# Patient Record
Sex: Male | Born: 1975
Health system: Southern US, Community
[De-identification: ages and names within clinical notes are randomized; demographics above are authoritative.]

## PROBLEM LIST (undated history)

## (undated) DIAGNOSIS — L0390 Cellulitis, unspecified: Secondary | ICD-10-CM

## (undated) DIAGNOSIS — E669 Obesity, unspecified: Secondary | ICD-10-CM

## (undated) DIAGNOSIS — I10 Essential (primary) hypertension: Secondary | ICD-10-CM

## (undated) DIAGNOSIS — E7800 Pure hypercholesterolemia, unspecified: Secondary | ICD-10-CM

## (undated) DIAGNOSIS — R600 Localized edema: Secondary | ICD-10-CM

## (undated) HISTORY — PX: OTHER SURGICAL HISTORY: SHX169

---

## 2005-11-05 ENCOUNTER — Emergency Department (HOSPITAL_COMMUNITY): Admission: EM | Admit: 2005-11-05 | Discharge: 2005-11-05 | Payer: Self-pay | Admitting: Emergency Medicine

## 2007-11-05 ENCOUNTER — Emergency Department (HOSPITAL_COMMUNITY): Admission: EM | Admit: 2007-11-05 | Discharge: 2007-11-06 | Payer: Self-pay | Admitting: Family Medicine

## 2008-02-06 ENCOUNTER — Emergency Department (HOSPITAL_COMMUNITY): Admission: EM | Admit: 2008-02-06 | Discharge: 2008-02-07 | Payer: Self-pay | Admitting: Emergency Medicine

## 2008-02-09 ENCOUNTER — Emergency Department (HOSPITAL_COMMUNITY): Admission: EM | Admit: 2008-02-09 | Discharge: 2008-02-09 | Payer: Self-pay | Admitting: Emergency Medicine

## 2009-11-29 ENCOUNTER — Emergency Department (HOSPITAL_COMMUNITY): Admission: EM | Admit: 2009-11-29 | Discharge: 2009-11-29 | Payer: Self-pay | Admitting: Family Medicine

## 2009-12-02 ENCOUNTER — Emergency Department (HOSPITAL_COMMUNITY): Admission: EM | Admit: 2009-12-02 | Discharge: 2009-12-02 | Payer: Self-pay | Admitting: Family Medicine

## 2010-04-28 ENCOUNTER — Emergency Department (HOSPITAL_COMMUNITY): Admission: EM | Admit: 2010-04-28 | Discharge: 2010-04-29 | Payer: Self-pay | Admitting: Emergency Medicine

## 2010-09-29 LAB — CBC
HCT: 44.2 % (ref 39.0–52.0)
Hemoglobin: 14.9 g/dL (ref 13.0–17.0)
MCHC: 33.7 g/dL (ref 30.0–36.0)
MCV: 87.7 fL (ref 78.0–100.0)
Platelets: 299 10*3/uL (ref 150–400)
RBC: 5.04 MIL/uL (ref 4.22–5.81)
RDW: 13.1 % (ref 11.5–15.5)
WBC: 6.9 10*3/uL (ref 4.0–10.5)

## 2010-09-29 LAB — DIFFERENTIAL
Basophils Absolute: 0 10*3/uL (ref 0.0–0.1)
Monocytes Relative: 11 % (ref 3–12)
Neutro Abs: 3.5 10*3/uL (ref 1.7–7.7)

## 2010-09-29 LAB — BASIC METABOLIC PANEL
BUN: 9 mg/dL (ref 6–23)
CO2: 32 mEq/L (ref 19–32)
Glucose, Bld: 98 mg/dL (ref 70–99)

## 2011-04-11 LAB — POCT I-STAT, CHEM 8
Chloride: 105
Glucose, Bld: 79
HCT: 44
Hemoglobin: 15
Sodium: 140

## 2011-04-11 LAB — POCT CARDIAC MARKERS: Troponin i, poc: <0.05

## 2012-04-18 ENCOUNTER — Observation Stay (HOSPITAL_COMMUNITY)
Admission: EM | Admit: 2012-04-18 | Discharge: 2012-04-19 | Disposition: A | Discharge status: Home / Self Care | Payer: Self-pay | Attending: Emergency Medicine | Admitting: Emergency Medicine

## 2012-04-18 ENCOUNTER — Encounter (HOSPITAL_COMMUNITY): Payer: Self-pay | Admitting: Emergency Medicine

## 2012-04-18 DIAGNOSIS — F17200 Nicotine dependence, unspecified, uncomplicated: Secondary | ICD-10-CM | POA: Insufficient documentation

## 2012-04-18 DIAGNOSIS — L0390 Cellulitis, unspecified: Secondary | ICD-10-CM | POA: Insufficient documentation

## 2012-04-18 DIAGNOSIS — L02419 Cutaneous abscess of limb, unspecified: Principal | ICD-10-CM | POA: Insufficient documentation

## 2012-04-18 DIAGNOSIS — I10 Essential (primary) hypertension: Secondary | ICD-10-CM | POA: Insufficient documentation

## 2012-04-18 HISTORY — DX: Essential (primary) hypertension: I10

## 2012-04-18 HISTORY — DX: Cellulitis, unspecified: L03.90

## 2012-04-18 LAB — COMPREHENSIVE METABOLIC PANEL
ALT: 11 U/L (ref 0–53)
Albumin: 3.9 g/dL (ref 3.5–5.2)
CO2: 30 mEq/L (ref 19–32)
Chloride: 102 mEq/L (ref 96–112)
Creatinine, Ser: 0.92 mg/dL (ref 0.50–1.35)
GFR calc Af Amer: >90 mL/min (ref >90)
Glucose, Bld: 125 mg/dL — ABNORMAL HIGH (ref 70–99)
Potassium: 3.9 mEq/L (ref 3.5–5.1)

## 2012-04-18 LAB — CBC WITH DIFFERENTIAL/PLATELET
Basophils Absolute: 0 10*3/uL (ref 0.0–0.1)
Basophils Relative: 0 % (ref 0–1)
Eosinophils Absolute: 0.2 10*3/uL (ref 0.0–0.7)
HCT: 43 % (ref 39.0–52.0)
Hemoglobin: 14.7 g/dL (ref 13.0–17.0)
Lymphocytes Relative: 37 % (ref 12–46)
Monocytes Absolute: 0.4 10*3/uL (ref 0.1–1.0)
Monocytes Relative: 6 % (ref 3–12)

## 2012-04-18 MED ORDER — SODIUM CHLORIDE 0.9 % IV SOLN
INTRAVENOUS | Status: DC
  Administered 2012-04-18: 23:00:00 via INTRAVENOUS

## 2012-04-18 MED ORDER — VANCOMYCIN HCL IN DEXTROSE 1-5 GM/200ML-% IV SOLN
1000.0000 mg | Freq: Once | INTRAVENOUS | Status: AC
  Administered 2012-04-18: 1000 mg via INTRAVENOUS
  Filled 2012-04-18: qty 200

## 2012-04-18 MED ORDER — VANCOMYCIN HCL IN DEXTROSE 1-5 GM/200ML-% IV SOLN
1000.0000 mg | Freq: Two times a day (BID) | INTRAVENOUS | Status: DC
  Administered 2012-04-19: 1000 mg via INTRAVENOUS
  Filled 2012-04-18: qty 200

## 2012-04-18 NOTE — ED Notes (Signed)
Patient complaining of left leg swelling since Tuesday; patient has used elevation and ice pack techniques to decrease the swelling.  While patient was working today, swelling increased.  Patient has been to Urgent Care in the past for similar symptoms and diagnosed with cellulitis.  Reports pain in left leg.

## 2012-04-18 NOTE — ED Provider Notes (Signed)
Medical screening examination/treatment/procedure(s) were conducted as a shared visit with non-physician practitioner(s) and myself.  I personally evaluated the patient during the encounter  Shelda Jakes, MD 04/18/12 2356

## 2012-04-18 NOTE — ED Provider Notes (Signed)
History     CSN: [id number]  Arrival date & time 04/18/12  2032   First MD Initiated Contact with Patient 04/18/12 2150      Chief Complaint  Patient presents with  . Leg Swelling    (Consider location/radiation/quality/duration/timing/severity/associated sxs/prior treatment) The history is provided by the patient.   patient is a 36 year old male presents with left lower trimming the swelling anterior part of the shin that started on Sunday that would be 5 days ago. It got worse on Tuesday patient's had similar problem in the past several months ago to even a year ago. No real pain in the left lower extremity patient reports maybe one out of 10. No chest pain no shortness of breath no fever no bowel pain no nausea no vomiting.  Past Medical History  Diagnosis Date  . Hypertension   . Cellulitis     Past Surgical History  Procedure Date  . Knee surgery     History reviewed. No pertinent family history.  History  Substance Use Topics  . Smoking status: Current Every Day Smoker -- 1.0 packs/day  . Smokeless tobacco: Not on file  . Alcohol Use: Yes      Review of Systems  Constitutional: Negative for fever.  HENT: Negative for congestion and neck pain.   Eyes: Negative for redness.  Respiratory: Negative for shortness of breath.   Cardiovascular: Positive for leg swelling. Negative for chest pain.  Gastrointestinal: Negative for nausea, vomiting and abdominal pain.  Genitourinary: Negative for dysuria.  Musculoskeletal: Negative for back pain.  Skin: Positive for color change. Negative for rash.  Neurological: Negative for headaches.  Hematological: Does not bruise/bleed easily.    Allergies  Review of patient's allergies indicates no known allergies.  Home Medications   Current Outpatient Rx  Name Route Sig Dispense Refill  . VALACYCLOVIR HCL 500 MG PO TABS Oral Take 500 mg by mouth daily as needed. Herpes      BP 158/93  Pulse 105  Temp 98.7 F (37.1  C) (Oral)  Resp 18  SpO2 94%  Physical Exam  Nursing note and vitals reviewed. Constitutional: He is oriented to person, place, and time. He appears well-developed and well-nourished. No distress.  HENT:  Head: Normocephalic and atraumatic.  Mouth/Throat: Oropharynx is clear and moist.  Eyes: Conjunctivae normal and EOM are normal. Pupils are equal, round, and reactive to light.  Neck: Normal range of motion. Neck supple.  Cardiovascular: Normal rate, regular rhythm and normal heart sounds.   No murmur heard. Pulmonary/Chest: Effort normal and breath sounds normal.  Abdominal: Soft. Bowel sounds are normal. There is no tenderness.  Musculoskeletal: He exhibits edema.       Swelling of the left lower extremity not above the knee. Mid part of extremity anteriorly he has erythema measuring about 10-12 cm in size. Increased warmth. No significant tenderness. Lower part of the extremity has some dark skin discoloration some chronic edema. There is edema of the leg and ankle.  Neurological: He is alert and oriented to person, place, and time. No cranial nerve deficit. He exhibits normal muscle tone. Coordination normal.  Skin: Skin is warm. There is erythema.    ED Course  Procedures (including critical care time)  Labs Reviewed  COMPREHENSIVE METABOLIC PANEL - Abnormal; Notable for the following:    Glucose, Bld 125 (*)     Calcium 11.3 (*)     Total Protein 8.6 (*)     All other components within normal limits  CBC WITH DIFFERENTIAL   No results found. Results for orders placed during the hospital encounter of 04/18/12  CBC WITH DIFFERENTIAL      Component Value Range   WBC 7.4  4.0 - 10.5 K/uL   RBC 5.01  4.22 - 5.81 MIL/uL   Hemoglobin 14.7  13.0 - 17.0 g/dL   HCT 09.8  11.9 - 14.7 %   MCV 85.8  78.0 - 100.0 fL   MCH 29.3  26.0 - 34.0 pg   MCHC 34.2  30.0 - 36.0 g/dL   RDW 82.9  56.2 - 13.0 %   Platelets 267  150 - 400 K/uL   Neutrophils Relative 55  43 - 77 %   Neutro  Abs 4.0  1.7 - 7.7 K/uL   Lymphocytes Relative 37  12 - 46 %   Lymphs Abs 2.7  0.7 - 4.0 K/uL   Monocytes Relative 6  3 - 12 %   Monocytes Absolute 0.4  0.1 - 1.0 K/uL   Eosinophils Relative 3  0 - 5 %   Eosinophils Absolute 0.2  0.0 - 0.7 K/uL   Basophils Relative 0  0 - 1 %   Basophils Absolute 0.0  0.0 - 0.1 K/uL  COMPREHENSIVE METABOLIC PANEL      Component Value Range   Sodium 140  135 - 145 mEq/L   Potassium 3.9  3.5 - 5.1 mEq/L   Chloride 102  96 - 112 mEq/L   CO2 30  19 - 32 mEq/L   Glucose, Bld 125 (*) 70 - 99 mg/dL   BUN 11  6 - 23 mg/dL   Creatinine, Ser 8.65  0.50 - 1.35 mg/dL   Calcium 78.4 (*) 8.4 - 10.5 mg/dL   Total Protein 8.6 (*) 6.0 - 8.3 g/dL   Albumin 3.9  3.5 - 5.2 g/dL   AST 15  0 - 37 U/L   ALT 11  0 - 53 U/L   Alkaline Phosphatase 73  39 - 117 U/L   Total Bilirubin 0.6  0.3 - 1.2 mg/dL   GFR calc non Af Amer >90  >90 mL/min   GFR calc Af Amer >90  >90 mL/min     1. Cellulitis       MDM    Patient clinically with a left lower extremity anterior cellulitis. Patient's had a history of that in the past not recently though. Patient will be given now 1 g of vancomycin IV piggyback we moved to CDU under the cellulitis protocol. Anticipate the patient will be a to be discharged home on doxycycline. Patient without any other significant symptoms.   Medical screening examination/treatment/procedure(s) were conducted as a shared visit with non-physician practitioner(s) and myself.  I personally evaluated the patient during the encounter         Shelda Jakes, MD 04/18/12 2242

## 2012-04-18 NOTE — ED Provider Notes (Signed)
10:53 PM Patient to move to CDU under observation, cellulitis protocol.  This is a shared visit with Dr Deretha Emory.  Sign out received from Dr Deretha Emory.  Pt with LLE cellulitis.  Plan is for cellulitis protocol overnight.  Pt receiving vancomycin.  To be reassessed in the morning.  May need to stay for second dose.  If appropriate, pt may be d/c home with doxycycline.    11:52 PM Patient has not yet been moved to CDU and has not been seen by me.  Patient signed out to Dr Bernette Mayers who assumes care of patient overnight.    [place], [place] 04/18/12 2352

## 2012-04-19 MED ORDER — CEPHALEXIN 500 MG PO CAPS: 500.0000 mg | ORAL_CAPSULE | Freq: Four times a day (QID) | ORAL | Status: DC

## 2012-04-19 MED ORDER — HYDROCODONE-ACETAMINOPHEN 5-325 MG PO TABS: ORAL_TABLET | ORAL | Status: DC

## 2012-04-19 MED ORDER — SULFAMETHOXAZOLE-TRIMETHOPRIM 800-160 MG PO TABS: 1.0000 | ORAL_TABLET | Freq: Two times a day (BID) | ORAL | Status: DC

## 2012-04-19 NOTE — ED Provider Notes (Signed)
Medical screening examination/treatment/procedure(s) were performed by non-physician practitioner and as supervising physician I was immediately available for consultation/collaboration.   Lyanne Co, MD 04/19/12 [phone]

## 2012-04-19 NOTE — ED Notes (Signed)
Patient ambulated to restroom and tolerated well.  

## 2012-04-19 NOTE — ED Notes (Signed)
Patient received meal tray.   Patient eating lunch.

## 2012-04-19 NOTE — ED Provider Notes (Signed)
7:45 AM Patient seen and examined. Previous notes reviewed.   Patient notes improvement in swelling and pain overnight. He has not yet been up walking.   Exam:  Gen NAD; Heart RRR, nml S1,S2, no m/r/g; Lungs CTAB; Abd soft, NT, no rebound or guarding; Ext 2+ pedal pulses bilaterally, bilateral lower extremity edema, light erythema anterior left lower leg, very mild warmth to touch.   Plan: Next dose of IV Vanc at 1045. Patient seems hesitant to stay at this point. Given improvement overnight, will likely d/c home on doxycycline this AM.    Patient completed second dose Vanc shortly after 1200. Continued to be stable/improved on re-exam.   Discharged patient on Bactrim and Keflex given patient paying for meds out-of-pocket.   Patient counseled on use of narcotic pain medications. Counseled not to combine these medications with others containing tylenol. Urged not to drink alcohol, drive, or perform any other activities that requires focus while taking these medications. The patient verbalizes understanding and agrees with the plan.  Pt urged to return with worsening pain, worsening swelling, expanding area of redness or streaking up extremity, fever, or any other concerns. Urged to take complete course of antibiotics as prescribed. Counseled to take pain medications as prescribed. Pt verbalizes understanding and agrees with plan.     Renne Crigler, [place] 04/19/12 1606

## 2012-04-19 NOTE — Progress Notes (Signed)
Utilization review completed.  

## 2012-04-22 ENCOUNTER — Emergency Department (HOSPITAL_COMMUNITY)
Admission: EM | Admit: 2012-04-22 | Discharge: 2012-04-22 | Disposition: A | Discharge status: Home / Self Care | Payer: Self-pay | Attending: Emergency Medicine | Admitting: Emergency Medicine

## 2012-04-22 ENCOUNTER — Encounter (HOSPITAL_COMMUNITY): Payer: Self-pay | Admitting: Emergency Medicine

## 2012-04-22 DIAGNOSIS — I10 Essential (primary) hypertension: Secondary | ICD-10-CM | POA: Insufficient documentation

## 2012-04-22 DIAGNOSIS — L02419 Cutaneous abscess of limb, unspecified: Secondary | ICD-10-CM | POA: Insufficient documentation

## 2012-04-22 DIAGNOSIS — F17200 Nicotine dependence, unspecified, uncomplicated: Secondary | ICD-10-CM | POA: Insufficient documentation

## 2012-04-22 DIAGNOSIS — L0390 Cellulitis, unspecified: Secondary | ICD-10-CM

## 2012-04-22 NOTE — ED Provider Notes (Signed)
History    This chart was scribed for [employer]. Rubin Payor, MD, MD by Smitty Pluck. The patient was seen in room TR09C and the patient's care was started at 11:44AM.   CSN: [id number]  Arrival date & time 04/22/12  1120   None     No chief complaint on file.   (Consider location/radiation/quality/duration/timing/severity/associated sxs/prior treatment) The history is provided by the patient. No language interpreter was used.   Timothy Roy is a 36 y.o. male who presents to the Emergency Department due to wound recheck on lower left leg. Pt reports that he has been taking abx for 2 days. He states that he has decreased pain and swelling. He denies any new symptoms and fever.   Past Medical History  Diagnosis Date  . Hypertension   . Cellulitis     Past Surgical History  Procedure Date  . Knee surgery     No family history on file.  History  Substance Use Topics  . Smoking status: Current Every Day Smoker -- 1.0 packs/day  . Smokeless tobacco: Not on file  . Alcohol Use: Yes      Review of Systems  Constitutional: Negative for fever and chills.  Respiratory: Negative for shortness of breath.   Gastrointestinal: Negative for nausea and vomiting.  Neurological: Negative for weakness.    Allergies  Review of patient's allergies indicates no known allergies.  Home Medications   Current Outpatient Rx  Name Route Sig Dispense Refill  . CEPHALEXIN 500 MG PO CAPS Oral Take 1 capsule (500 mg total) by mouth 4 (four) times daily. 28 capsule 0  . HYDROCODONE-ACETAMINOPHEN 5-325 MG PO TABS  Take 1-2 tablets every 6 hours as needed for severe pain 12 tablet 0  . IBUPROFEN 200 MG PO TABS Oral Take 400 mg by mouth every 6 (six) hours as needed. For headache    . SULFAMETHOXAZOLE-TRIMETHOPRIM 800-160 MG PO TABS Oral Take 1 tablet by mouth every 12 (twelve) hours. 14 tablet 0  . VALACYCLOVIR HCL 500 MG PO TABS Oral Take 500 mg by mouth daily as needed. Herpes      BP  140/84  Pulse 75  Temp 98.4 F (36.9 C)  Resp 16  SpO2 97%  Physical Exam  Nursing note and vitals reviewed. Constitutional: He is oriented to person, place, and time. He appears well-developed and well-nourished. No distress.  HENT:  Head: Normocephalic and atraumatic.  Eyes: EOM are normal.  Neck: Neck supple. No tracheal deviation present.  Cardiovascular: Normal rate.   Pulmonary/Chest: Effort normal. No respiratory distress.  Musculoskeletal: Normal range of motion.  Neurological: He is alert and oriented to person, place, and time.  Skin: Skin is warm and dry.       Chronic changes of wound on lower anterior tibia Induration present Minimal erythema and swelling present compared to previous note  Psychiatric: He has a normal mood and affect. His behavior is normal.    ED Course  Procedures (including critical care time)  COORDINATION OF CARE: 11:46 AM Discussed ED treatment with pt     Labs Reviewed - No data to display No results found.   1. Cellulitis       MDM  Patient with wound recheck for lower leg. He appears to be healing well.will continue antibiotics      I personally performed the services described in this documentation, which was scribed in my presence. The recorded information has been reviewed and considered.     Juliet Rude. Rubin Payor,  MD 04/22/12 1617

## 2012-04-22 NOTE — ED Notes (Signed)
Here for wound recheck for left lower leg area looks well healed

## 2013-03-16 ENCOUNTER — Emergency Department (HOSPITAL_COMMUNITY)
Admission: EM | Admit: 2013-03-16 | Discharge: 2013-03-16 | Disposition: A | Discharge status: Home / Self Care | Payer: Self-pay | Attending: Emergency Medicine | Admitting: Emergency Medicine

## 2013-03-16 ENCOUNTER — Encounter (HOSPITAL_COMMUNITY): Payer: Self-pay | Admitting: Emergency Medicine

## 2013-03-16 DIAGNOSIS — I10 Essential (primary) hypertension: Secondary | ICD-10-CM | POA: Insufficient documentation

## 2013-03-16 DIAGNOSIS — Z872 Personal history of diseases of the skin and subcutaneous tissue: Secondary | ICD-10-CM | POA: Insufficient documentation

## 2013-03-16 DIAGNOSIS — Z79899 Other long term (current) drug therapy: Secondary | ICD-10-CM | POA: Insufficient documentation

## 2013-03-16 DIAGNOSIS — R51 Headache: Secondary | ICD-10-CM | POA: Insufficient documentation

## 2013-03-16 DIAGNOSIS — E669 Obesity, unspecified: Secondary | ICD-10-CM | POA: Insufficient documentation

## 2013-03-16 DIAGNOSIS — F17200 Nicotine dependence, unspecified, uncomplicated: Secondary | ICD-10-CM | POA: Insufficient documentation

## 2013-03-16 MED ORDER — HYDROCHLOROTHIAZIDE 25 MG PO TABS
12.5000 mg | ORAL_TABLET | Freq: Every day | ORAL | Status: DC
Start: 2013-03-16 — End: 2013-11-03

## 2013-03-16 NOTE — ED Notes (Signed)
Pt.reported elevated blood pressure this evening at [facility]= 168/100 , slight headache.

## 2013-03-16 NOTE — ED Provider Notes (Signed)
Medical screening examination/treatment/procedure(s) were performed by non-physician practitioner and as supervising physician I was immediately available for consultation/collaboration.    Vida Roller, MD 03/16/13 [phone]

## 2013-03-16 NOTE — ED Provider Notes (Signed)
CSN: [id number]     Arrival date & time 03/16/13  2122 History   First MD Initiated Contact with Patient 03/16/13 2129     Chief Complaint  Patient presents with  . Hypertension   (Consider location/radiation/quality/duration/timing/severity/associated sxs/prior Treatment) HPI  37 year old male  presents for evaluations of high blood pressure. Patient states 5 days ago he developed a gradual onset of headache. Describe headaches as an achy throbbing sensation to forehead and behind both his eyes. Headache lasting for 2 days and resolved without any specific treatment. Since he has a family history of high blood pressure, family member suggest he may need to check his blood pressure. Patient check his blood pressure at [facility] and noticed that his blood pressure was elevated. Therefore he decided to come to ER for further evaluation. Currently patient denies having any significant headache, visual changes, chest pain, shortness of breath, numbness, weakness, or any other symptoms. He is a smoker. Denies any significant history of diabetes or any other chronic medical problems. Does not take any medication on a regular basis.  Past Medical History  Diagnosis Date  . Hypertension   . Cellulitis    Past Surgical History  Procedure Laterality Date  . Knee surgery     No family history on file. History  Substance Use Topics  . Smoking status: Current Every Day Smoker -- 1.00 packs/day  . Smokeless tobacco: Not on file  . Alcohol Use: Yes    Review of Systems  Constitutional: Negative for fever.  HENT: Negative for neck pain.   Eyes: Negative for pain.  Respiratory: Negative for chest tightness and shortness of breath.   Cardiovascular: Negative for chest pain.  Gastrointestinal: Negative for abdominal pain.  Skin: Negative for rash.  Neurological: Negative for light-headedness, numbness and headaches.  All other systems reviewed and are negative.    Allergies  Review of  patient's allergies indicates no known allergies.  Home Medications   Current Outpatient Rx  Name  Route  Sig  Dispense  Refill  . cephALEXin (KEFLEX) 500 MG capsule   Oral   Take 1 capsule (500 mg total) by mouth 4 (four) times daily.   28 capsule   0   . HYDROcodone-acetaminophen (NORCO/VICODIN) 5-325 MG per tablet      Take 1-2 tablets every 6 hours as needed for severe pain   12 tablet   0   . ibuprofen (ADVIL,MOTRIN) 200 MG tablet   Oral   Take 400 mg by mouth every 6 (six) hours as needed. For headache         . sulfamethoxazole-trimethoprim (SEPTRA DS) 800-160 MG per tablet   Oral   Take 1 tablet by mouth every 12 (twelve) hours.   14 tablet   0   . valACYclovir (VALTREX) 500 MG tablet   Oral   Take 500 mg by mouth daily as needed. Herpes          BP 196/78  Pulse 62  Temp(Src) 98.3 F (36.8 C) (Oral)  Resp 24  SpO2 96% Physical Exam  Nursing note and vitals reviewed. Constitutional: He is oriented to person, place, and time. He appears well-developed and well-nourished. No distress.  Awake, alert, nontoxic appearance  Moderately obese  HENT:  Head: Atraumatic.  Eyes: Conjunctivae and EOM are normal. Right eye exhibits no discharge. Left eye exhibits no discharge.  No retinal hemorrhage, no cotton wool spot,normal fundi bilaterally on fundoscopic exam  Neck: Normal range of motion. Neck supple.  Nu  nuchal rigidity  Cardiovascular: Normal rate, regular rhythm and intact distal pulses.   Pulmonary/Chest: Effort normal. No respiratory distress. He exhibits no tenderness.  Abdominal: Soft. There is no tenderness. There is no rebound.  Musculoskeletal: He exhibits no edema and no tenderness.  ROM appears intact, no obvious focal weakness  Neurological: He is alert and oriented to person, place, and time.  Skin: Skin is warm and dry. No rash noted.  Psychiatric: He has a normal mood and affect.    ED Course  Procedures (including critical care  time)  9:49 PM Pt with asymptomatic hypertension.  No active headache, no red flags.  Not currently on any antihypertensive medication.  No PCP.  Plan to start pt on HCTZ, smoking cessation discussed.  Pt to f/u with PCP in 2 weeks for BP recheck and check K+.  Care discussed with attending.  Return precaution discussed.     Labs Review Labs Reviewed - No data to display Imaging Review No results found.  MDM   1. HTN (hypertension)    BP 165/101  Pulse 62  Temp(Src) 98.3 F (36.8 C) (Oral)  Resp 24  SpO2 100%     Fayrene Helper, PA-C 03/16/13 2211

## 2013-03-16 NOTE — ED Notes (Signed)
Pt reports 3 day HA from Tuesday- Friday that resolved on its own. Checked BP at [facility] and found to be elevated. PT denies pain.

## 2013-07-12 ENCOUNTER — Emergency Department (INDEPENDENT_AMBULATORY_CARE_PROVIDER_SITE_OTHER)
Admission: EM | Admit: 2013-07-12 | Discharge: 2013-07-12 | Disposition: A | Discharge status: Home / Self Care | Payer: Self-pay | Source: Home / Self Care | Attending: Emergency Medicine | Admitting: Emergency Medicine

## 2013-07-12 ENCOUNTER — Encounter (HOSPITAL_COMMUNITY): Payer: Self-pay | Admitting: Emergency Medicine

## 2013-07-12 DIAGNOSIS — Z87898 Personal history of other specified conditions: Secondary | ICD-10-CM

## 2013-07-12 DIAGNOSIS — I10 Essential (primary) hypertension: Secondary | ICD-10-CM

## 2013-07-12 DIAGNOSIS — Z8709 Personal history of other diseases of the respiratory system: Secondary | ICD-10-CM

## 2013-07-12 MED ORDER — LISINOPRIL 20 MG PO TABS
20.0000 mg | ORAL_TABLET | Freq: Every day | ORAL | Status: DC
Start: 2013-07-12 — End: 2013-11-03

## 2013-07-12 NOTE — ED Provider Notes (Signed)
CSN: [id number]     Arrival date & time 07/12/13  1344 History   First MD Initiated Contact with Patient 07/12/13 1637     Chief Complaint  Patient presents with  . Epistaxis   (Consider location/radiation/quality/duration/timing/severity/associated sxs/prior Treatment) HPI Comments: Patient presents for intermittent episodes of epistaxis over past several days at home. Denies recent illness or injury and he is able to stop bleeding at home with direct pressure. Denies bleeding disorder. Does often use aspirin for occasional mild headache.  The history is provided by the patient.    Past Medical History  Diagnosis Date  . Hypertension   . Cellulitis    Past Surgical History  Procedure Laterality Date  . Knee surgery     No family history on file. History  Substance Use Topics  . Smoking status: Current Every Day Smoker -- 1.00 packs/day  . Smokeless tobacco: Not on file  . Alcohol Use: Yes    Review of Systems  All other systems reviewed and are negative.    Allergies  Review of patient's allergies indicates no known allergies.  Home Medications   Current Outpatient Rx  Name  Route  Sig  Dispense  Refill  . hydrochlorothiazide (HYDRODIURIL) 25 MG tablet   Oral   Take 0.5 tablets (12.5 mg total) by mouth daily.   30 tablet   0   . ibuprofen (ADVIL,MOTRIN) 200 MG tablet   Oral   Take 400 mg by mouth every 6 (six) hours as needed. For headache         . valACYclovir (VALTREX) 500 MG tablet   Oral   Take 500 mg by mouth daily as needed. Herpes          BP 187/130  Pulse 74  Temp(Src) 98.2 F (36.8 C) (Oral)  Resp 16  SpO2 96% Physical Exam  Nursing note and vitals reviewed. Constitutional: He is oriented to person, place, and time. He appears well-developed and well-nourished. No distress.  +morbidly obese  HENT:  Head: Normocephalic and atraumatic.  Mouth/Throat: Oropharynx is clear and moist and mucous membranes are normal.  Small areas of  excoriated tissue on anterior bilateral nasal septum, each with small amount of dried blood. No active bleeding.  Eyes: Conjunctivae are normal. Right eye exhibits no discharge. Left eye exhibits no discharge.  Neck: Normal range of motion. Neck supple.  Cardiovascular: Normal rate.   Pulmonary/Chest: Effort normal and breath sounds normal.  Musculoskeletal: Normal range of motion.  Neurological: He is alert and oriented to person, place, and time.  Skin: Skin is warm and dry.  Psychiatric: He has a normal mood and affect. His behavior is normal.    ED Course  Procedures (including critical care time) Labs Review Labs Reviewed - No data to display Imaging Review No results found.  EKG Interpretation    Date/Time:    Ventricular Rate:    PR Interval:    QRS Duration:   QT Interval:    QTC Calculation:   R Axis:     Text Interpretation:              MDM  Advised patient that while I am concerned about his occasional episodes of epistaxis, what concerns me more is his markedly elevated blood pressure. He does admit that he is quite often non-compliant with medication and it has been "a very long time" since he has been seen by a primary care provider. I stressed to him the importance of blood pressure management  and complications if BP is not controlled and need PCP follow up. Told him i would start him on lisinopril 20 mg po QD but it was his responsibility to take medication and to follow up with his PCP for further management. I advised using humidifier in his bedroom and thin layer of  petroleum jelly inside both nostrils to help with healing of excoriated areas on his bilateral nasal septum.     Jess Barters [place], [place] 07/12/13 1719

## 2013-07-12 NOTE — ED Provider Notes (Signed)
Medical screening examination/treatment/procedure(s) were performed by non-physician practitioner and as supervising physician I was immediately available for consultation/collaboration.  Ashyra Cantin, M.D.  Ron Junco C Jezabella Schriever, MD 07/12/13 2320 

## 2013-07-12 NOTE — ED Notes (Signed)
Pt c/o intermittent and random heavy nose bleeds onset 4 days that will last for 5 minutes Denies: inj/trauma, f/v/n/d BP today is 187/130... Denies: CP, SOB, HA, BV, weakness, nauseas, diaphoresis Reports he had his BP med today... Does not know the name of it.... Reports he was inebriated last night  He is alert and talking in complete sentences.

## 2013-07-20 ENCOUNTER — Telehealth (HOSPITAL_COMMUNITY): Payer: Self-pay | Admitting: *Deleted

## 2013-07-20 NOTE — ED Notes (Addendum)
Narda BondsLee Presson PA asked me to call pt. to f/u with PCP or come back here in 7-10 days for BMET after starting his BP medication.  I called pt. He said he does not have a PCP. No insurance until Feb.  He has not filled his Rx. yet. I stressed the importance of getting on it ASAP.  He said he would fill it today. I told him to come back 1/11-1/14. He voiced understanding. Cherly AndersonYork, Willliam Pettet M 07/20/2013 I called pt. back and explained that the PA wants him to come in 2 weeks after starting the medication.  She thought you had already started the medication when she said 7-10 days.  He said he started it Sunday and said he will come in 2 weeks. 07/22/2013

## 2013-11-03 ENCOUNTER — Inpatient Hospital Stay (HOSPITAL_COMMUNITY)
Admission: EM | Admit: 2013-11-03 | Discharge: 2013-11-05 | DRG: 603 | Disposition: A | Discharge status: Home / Self Care | Payer: 59 | Attending: Internal Medicine | Admitting: Internal Medicine

## 2013-11-03 ENCOUNTER — Encounter (HOSPITAL_COMMUNITY): Payer: Self-pay | Admitting: Emergency Medicine

## 2013-11-03 DIAGNOSIS — R7309 Other abnormal glucose: Secondary | ICD-10-CM | POA: Diagnosis present

## 2013-11-03 DIAGNOSIS — Z9119 Patient's noncompliance with other medical treatment and regimen: Secondary | ICD-10-CM

## 2013-11-03 DIAGNOSIS — F1090 Alcohol use, unspecified, uncomplicated: Secondary | ICD-10-CM

## 2013-11-03 DIAGNOSIS — R7303 Prediabetes: Secondary | ICD-10-CM

## 2013-11-03 DIAGNOSIS — R739 Hyperglycemia, unspecified: Secondary | ICD-10-CM

## 2013-11-03 DIAGNOSIS — Z6841 Body Mass Index (BMI) 40.0 and over, adult: Secondary | ICD-10-CM

## 2013-11-03 DIAGNOSIS — L0390 Cellulitis, unspecified: Secondary | ICD-10-CM

## 2013-11-03 DIAGNOSIS — Z789 Other specified health status: Secondary | ICD-10-CM

## 2013-11-03 DIAGNOSIS — L02419 Cutaneous abscess of limb, unspecified: Principal | ICD-10-CM | POA: Diagnosis present

## 2013-11-03 DIAGNOSIS — M7989 Other specified soft tissue disorders: Secondary | ICD-10-CM

## 2013-11-03 DIAGNOSIS — Z79899 Other long term (current) drug therapy: Secondary | ICD-10-CM

## 2013-11-03 DIAGNOSIS — F1010 Alcohol abuse, uncomplicated: Secondary | ICD-10-CM | POA: Diagnosis present

## 2013-11-03 DIAGNOSIS — Z91199 Patient's noncompliance with other medical treatment and regimen due to unspecified reason: Secondary | ICD-10-CM

## 2013-11-03 DIAGNOSIS — I10 Essential (primary) hypertension: Secondary | ICD-10-CM | POA: Diagnosis present

## 2013-11-03 DIAGNOSIS — L03119 Cellulitis of unspecified part of limb: Principal | ICD-10-CM

## 2013-11-03 DIAGNOSIS — F17200 Nicotine dependence, unspecified, uncomplicated: Secondary | ICD-10-CM | POA: Diagnosis present

## 2013-11-03 LAB — BASIC METABOLIC PANEL
BUN: 22 mg/dL (ref 6–23)
CHLORIDE: 97 meq/L (ref 96–112)
CO2: 25 mEq/L (ref 19–32)
CREATININE: 1.12 mg/dL (ref 0.50–1.35)
Calcium: 10.9 mg/dL — ABNORMAL HIGH (ref 8.4–10.5)
GFR calc Af Amer: >90 mL/min (ref >90)
GFR, EST NON AFRICAN AMERICAN: 83 mL/min — AB (ref >90)
GLUCOSE: 136 mg/dL — AB (ref 70–99)
Potassium: 3.9 mEq/L (ref 3.7–5.3)
Sodium: 137 mEq/L (ref 137–147)

## 2013-11-03 LAB — CBC WITH DIFFERENTIAL/PLATELET
BASOS ABS: 0 10*3/uL (ref 0.0–0.1)
BASOS PCT: 0 % (ref 0–1)
Eosinophils Absolute: 0.1 10*3/uL (ref 0.0–0.7)
Eosinophils Relative: 1 % (ref 0–5)
HCT: 42.1 % (ref 39.0–52.0)
HEMOGLOBIN: 15.1 g/dL (ref 13.0–17.0)
LYMPHS ABS: 1.9 10*3/uL (ref 0.7–4.0)
Lymphocytes Relative: 17 % (ref 12–46)
MCH: 30.3 pg (ref 26.0–34.0)
MCHC: 35.9 g/dL (ref 30.0–36.0)
MCV: 84.4 fL (ref 78.0–100.0)
MONO ABS: 1.2 10*3/uL — AB (ref 0.1–1.0)
Monocytes Relative: 11 % (ref 3–12)
NEUTROS ABS: 7.6 10*3/uL (ref 1.7–7.7)
Neutrophils Relative %: 71 % (ref 43–77)
PLATELETS: 270 10*3/uL (ref 150–400)
RBC: 4.99 MIL/uL (ref 4.22–5.81)
RDW: 14 % (ref 11.5–15.5)
WBC: 10.8 10*3/uL — ABNORMAL HIGH (ref 4.0–10.5)

## 2013-11-03 LAB — GLUCOSE, CAPILLARY: GLUCOSE-CAPILLARY: 184 mg/dL — AB (ref 70–99)

## 2013-11-03 MED ORDER — VANCOMYCIN HCL 10 G IV SOLR
2000.0000 mg | Freq: Two times a day (BID) | INTRAVENOUS | Status: DC
  Administered 2013-11-04 – 2013-11-05 (×3): 2000 mg via INTRAVENOUS
  Filled 2013-11-03 (×4): qty 2000

## 2013-11-03 MED ORDER — THIAMINE HCL 100 MG/ML IJ SOLN
100.0000 mg | Freq: Every day | INTRAMUSCULAR | Status: DC
  Filled 2013-11-03 (×3): qty 1

## 2013-11-03 MED ORDER — HYDROCODONE-ACETAMINOPHEN 5-325 MG PO TABS
1.0000 | ORAL_TABLET | ORAL | Status: DC | PRN
  Administered 2013-11-03: 2 via ORAL
  Administered 2013-11-04: 1 via ORAL
  Administered 2013-11-05: 2 via ORAL
  Filled 2013-11-03 (×2): qty 2
  Filled 2013-11-03: qty 1

## 2013-11-03 MED ORDER — INSULIN ASPART 100 UNIT/ML ~~LOC~~ SOLN: 0.0000 [IU] | Freq: Every day | SUBCUTANEOUS | Status: DC

## 2013-11-03 MED ORDER — ENOXAPARIN SODIUM 80 MG/0.8ML ~~LOC~~ SOLN
80.0000 mg | SUBCUTANEOUS | Status: DC
  Administered 2013-11-03 – 2013-11-04 (×2): 80 mg via SUBCUTANEOUS
  Filled 2013-11-03 (×3): qty 0.8

## 2013-11-03 MED ORDER — VITAMIN B-1 100 MG PO TABS
100.0000 mg | ORAL_TABLET | Freq: Every day | ORAL | Status: DC
  Administered 2013-11-03 – 2013-11-05 (×3): 100 mg via ORAL
  Filled 2013-11-03 (×3): qty 1

## 2013-11-03 MED ORDER — LORAZEPAM 2 MG/ML IJ SOLN
1.0000 mg | Freq: Four times a day (QID) | INTRAMUSCULAR | Status: DC | PRN
Start: 2013-11-03 — End: 2013-11-05

## 2013-11-03 MED ORDER — VANCOMYCIN HCL IN DEXTROSE 1-5 GM/200ML-% IV SOLN
1000.0000 mg | Freq: Once | INTRAVENOUS | Status: AC
  Administered 2013-11-03: 1000 mg via INTRAVENOUS
  Filled 2013-11-03: qty 200

## 2013-11-03 MED ORDER — LORAZEPAM 1 MG PO TABS: 0.0000 mg | ORAL_TABLET | Freq: Four times a day (QID) | ORAL | Status: DC

## 2013-11-03 MED ORDER — AMLODIPINE BESYLATE 5 MG PO TABS
5.0000 mg | ORAL_TABLET | Freq: Every day | ORAL | Status: DC
  Administered 2013-11-04 – 2013-11-05 (×2): 5 mg via ORAL
  Filled 2013-11-03 (×2): qty 1

## 2013-11-03 MED ORDER — AMLODIPINE BESYLATE 5 MG PO TABS
5.0000 mg | ORAL_TABLET | Freq: Every day | ORAL | Status: DC
  Filled 2013-11-03: qty 1

## 2013-11-03 MED ORDER — ADULT MULTIVITAMIN W/MINERALS CH
1.0000 | ORAL_TABLET | Freq: Every day | ORAL | Status: DC
  Administered 2013-11-03 – 2013-11-05 (×3): 1 via ORAL
  Filled 2013-11-03 (×3): qty 1

## 2013-11-03 MED ORDER — INSULIN ASPART 100 UNIT/ML ~~LOC~~ SOLN
0.0000 [IU] | Freq: Three times a day (TID) | SUBCUTANEOUS | Status: DC
  Administered 2013-11-04: 3 [IU] via SUBCUTANEOUS
  Administered 2013-11-04: 2 [IU] via SUBCUTANEOUS

## 2013-11-03 MED ORDER — FOLIC ACID 1 MG PO TABS
1.0000 mg | ORAL_TABLET | Freq: Every day | ORAL | Status: DC
  Administered 2013-11-03 – 2013-11-05 (×3): 1 mg via ORAL
  Filled 2013-11-03 (×3): qty 1

## 2013-11-03 MED ORDER — LORAZEPAM 1 MG PO TABS: 0.0000 mg | ORAL_TABLET | Freq: Two times a day (BID) | ORAL | Status: DC

## 2013-11-03 MED ORDER — LORAZEPAM 1 MG PO TABS
1.0000 mg | ORAL_TABLET | Freq: Four times a day (QID) | ORAL | Status: DC | PRN
  Administered 2013-11-04: 1 mg via ORAL
  Filled 2013-11-03: qty 1

## 2013-11-03 NOTE — Progress Notes (Signed)
*  PRELIMINARY RESULTS* Vascular Ultrasound Left lower extremity venous duplex has been completed.  Preliminary findings: no evidence of DVT in visualized veins. Enlarged inguinal lymph nodes noted on the left.   Glendale ChardJill I Eunice RVT 11/03/2013, 2:56 PM

## 2013-11-03 NOTE — ED Provider Notes (Signed)
CSN: [id number]     Arrival date & time 11/03/13  1141 History   First MD Initiated Contact with Patient 11/03/13 1254     Chief Complaint  Patient presents with  . Leg Swelling     (Consider location/radiation/quality/duration/timing/severity/associated sxs/prior Treatment) The history is provided by the patient.   patient here with 4 days of left lower extremity swelling and erythema. History of cellulitis and this is similar. Denies any chest pain or shortness of breath. Symptoms have been increasing and no known fever at home. No vomiting or diarrhea. No treatment used prior to arrival. Characterized as sharp and worse with walking. Denies any history of injury or trauma.  Past Medical History  Diagnosis Date  . Hypertension   . Cellulitis    Past Surgical History  Procedure Laterality Date  . Knee surgery     No family history on file. History  Substance Use Topics  . Smoking status: Current Every Day Smoker -- 1.00 packs/day  . Smokeless tobacco: Not on file  . Alcohol Use: Yes    Review of Systems  All other systems reviewed and are negative.     Allergies  Review of patient's allergies indicates no known allergies.  Home Medications   Prior to Admission medications   Medication Sig Start Date End Date Taking? Authorizing Provider  hydrochlorothiazide (HYDRODIURIL) 25 MG tablet Take 0.5 tablets (12.5 mg total) by mouth daily. 03/16/13   Fayrene HelperBowie Tran, PA-C  ibuprofen (ADVIL,MOTRIN) 200 MG tablet Take 400 mg by mouth every 6 (six) hours as needed. For headache    Historical Provider, MD  lisinopril (PRINIVIL,ZESTRIL) 20 MG tablet Take 1 tablet (20 mg total) by mouth daily. 07/12/13   Ardis RowanJennifer Lee Presson, PA  valACYclovir (VALTREX) 500 MG tablet Take 500 mg by mouth daily as needed. Herpes    Historical Provider, MD   BP 159/92  Pulse 93  Temp(Src) 98.4 F (36.9 C) (Oral)  Resp 18  Ht 5\' 7"  (1.702 m)  Wt 345 lb 7 oz (156.689 kg)  BMI 54.09 kg/m2  SpO2  96% Physical Exam  Nursing note and vitals reviewed. Constitutional: He is oriented to person, place, and time. He appears well-developed and well-nourished.  Non-toxic appearance. No distress.  HENT:  Head: Normocephalic and atraumatic.  Eyes: Conjunctivae, EOM and lids are normal. Pupils are equal, round, and reactive to light.  Neck: Normal range of motion. Neck supple. No tracheal deviation present. No mass present.  Cardiovascular: Normal rate, regular rhythm and normal heart sounds.  Exam reveals no gallop.   No murmur heard. Pulmonary/Chest: Effort normal and breath sounds normal. No stridor. No respiratory distress. He has no decreased breath sounds. He has no wheezes. He has no rhonchi. He has no rales.  Abdominal: Soft. Normal appearance and bowel sounds are normal. He exhibits no distension. There is no tenderness. There is no rebound and no CVA tenderness.  Musculoskeletal: Normal range of motion. He exhibits no edema and no tenderness.       Legs: Neurological: He is alert and oriented to person, place, and time. He has normal strength. No cranial nerve deficit or sensory deficit. GCS eye subscore is 4. GCS verbal subscore is 5. GCS motor subscore is 6.  Skin: Skin is warm and dry. No abrasion and no rash noted.  Psychiatric: He has a normal mood and affect. His speech is normal and behavior is normal.    ED Course  Procedures (including critical care time) Labs Review Labs Reviewed  CBC WITH DIFFERENTIAL - Abnormal; Notable for the following:    WBC 10.8 (*)    Monocytes Absolute 1.2 (*)    All other components within normal limits  BASIC METABOLIC PANEL - Abnormal; Notable for the following:    Glucose, Bld 136 (*)    Calcium 10.9 (*)    GFR calc non Af Amer 83 (*)    All other components within normal limits    Imaging Review No results found.   EKG Interpretation None      MDM   Final diagnoses:  None    Patient given vancomycin 1 g. Doppler of his leg  was negative. Will be admitted for treatment of cellulitis    Toy BakerAnthony T Jacklin Zwick, MD 11/03/13 1506

## 2013-11-03 NOTE — Progress Notes (Signed)
ANTIBIOTIC CONSULT NOTE - INITIAL  Pharmacy Consult for vancomycin Indication: cellulitis  No Known Allergies  Patient Measurements: Height: 5\' 7"  (170.2 cm) Weight: 345 lb 7 oz (156.689 kg) IBW/kg (Calculated) : 66.1  Vital Signs: Temp: 99.2 F (37.3 C) (04/20 1648) Temp src: Oral (04/20 1648) BP: 124/69 mmHg (04/20 1730) Pulse Rate: 84 (04/20 1730) Intake/Output from previous day:   Intake/Output from this shift:    Labs:  Recent Labs  11/03/13 1204  WBC 10.8*  HGB 15.1  PLT 270  CREATININE 1.12   Estimated Creatinine Clearance: 131.9 ml/min (by C-G formula based on Cr of 1.12). No results found for this basename: VANCOTROUGH, VANCOPEAK, VANCORANDOM, GENTTROUGH, GENTPEAK, GENTRANDOM, TOBRATROUGH, TOBRAPEAK, TOBRARND, AMIKACINPEAK, AMIKACINTROU, AMIKACIN,  in the last 72 hours   Microbiology: No results found for this or any previous visit (from the past 720 hour(s)).  Medical History: Past Medical History  Diagnosis Date  . Hypertension   . Cellulitis     Medications:  Prescriptions prior to admission  Medication Sig Dispense Refill  . Multiple Vitamins-Minerals (MULTIVITAMIN PO) Take 1 tablet by mouth daily.      Marland Kitchen. Phenyleph-Doxylamine-DM-APAP (ALKA SELTZER PLUS PO) Take 1 tablet by mouth daily as needed (cold symptoms).      . rizatriptan (MAXALT-MLT) 5 MG disintegrating tablet Take 5 mg by mouth as needed for migraine. May repeat in 2 hours if needed       Scheduled:  . amLODipine  5 mg Oral Daily  . enoxaparin (LOVENOX) injection  40 mg Subcutaneous Q24H  . folic acid  1 mg Oral Daily  . [START ON 11/04/2013] insulin aspart  0-15 Units Subcutaneous TID WC  . insulin aspart  0-5 Units Subcutaneous QHS  . LORazepam  0-4 mg Oral Q6H   Followed by  . [START ON 11/05/2013] LORazepam  0-4 mg Oral Q12H  . multivitamin with minerals  1 tablet Oral Daily  . thiamine  100 mg Oral Daily   Or  . thiamine  100 mg Intravenous Daily    Assessment: 38 yo male  here with pain, soreness, swelling, and erythema of his left lower leg to begin vancomycin for cellulitis. WBC= 10.8, afebrile, SCr= 1.12 and CrCl > 100. Vancomycin 1000mg  IV was given at 2pm today.   4/20 vanc>.   Goal of Therapy:  Vancomycin trough level 10-15 mcg/ml  Plan:  -Vancomycin 2000mg  IV q12h (due to weight and age) -Will follow renal function, cultures and clinical progress  Harland Germanndrew Jhonny Calixto, Ilda Bassetharm D 11/03/2013 6:39 PM

## 2013-11-03 NOTE — ED Notes (Signed)
Pt reporting left leg swelling and redness for several days. Pt able to bear wt, ambulatory. Pt is a x 4. Denies fever.

## 2013-11-03 NOTE — Progress Notes (Signed)
NURSING PROGRESS NOTE  Timothy Roy [id number] Admission Data: 11/03/2013 6:37 PM Attending Provider: Aletta EdouardShilpa Bhardwaj, MD PCP:No PCP Per Patient Code Status: full  Timothy OlpChristopher Roy is a 38 y.o. male patient admitted from ED:  -No acute distress noted.  -No complaints of shortness of breath.  -No complaints of chest pain.     Blood pressure 124/69, pulse 84, temperature 99.2 F (37.3 C), temperature source Oral, resp. rate 18, height 5\' 7"  (1.702 m), weight 156.689 kg (345 lb 7 oz), SpO2 95.00%.   IV Fluids:  IV in place, occlusive dsg intact without redness, IV cath antecubital right, condition patent and no redness none.   Allergies:  Review of patient's allergies indicates no known allergies.  Past Medical History:   has a past medical history of Hypertension and Cellulitis.  Past Surgical History:   has past surgical history that includes knee surgery.  Social History:   reports that he has been smoking.  He does not have any smokeless tobacco history on file. He reports that he drinks alcohol. He reports that he does not use illicit drugs.  Skin: cellulitis Left lower extremity cellulitis   Patient/Family orientated to room. Information packet given to patient/family. Admission inpatient armband information verified with patient/family to include name and date of birth and placed on patient arm. Side rails up x 2, fall assessment and education completed with patient/family. Patient/family able to verbalize understanding of risk associated with falls and verbalized understanding to call for assistance before getting out of bed. Call light within reach. Patient/family able to voice and demonstrate understanding of unit orientation instructions.    Will continue to evaluate and treat per MD orders.

## 2013-11-03 NOTE — H&P (Signed)
Date: 11/03/2013               Patient Name:  Timothy Roy MRN: [id number]  DOB: 22-May-1976 Age / Sex: 38 y.o., male   PCP: No Pcp Per Patient              Medical Service: Internal Medicine Teaching Service              Attending Physician: Dr. Aletta EdouardShilpa Bhardwaj, MD    First Contact: Camille BalJimmy Noemy Hallmon, MS 3 Pager: [phone]  Second Contact: Dr. Darci Needlehikowski Pager: [phone]  Third Contact Dr. Sherrine MaplesGlenn Pager: [phone]       After Hours (After 5p/  First Contact Pager: [phone]  weekends / holidays): Second Contact Pager: [phone]   Chief Complaint:   Left lower leg pain  History of Present Illness:  [name] presents to the ED with pain, soreness, swelling, and erythema of his left lower leg. He reports that he recently had the flu from Wednesday to Saturday and on Saturday morning began noticing pain in his left lower foot from his ankle to his shin. He continued to work on the leg on Sunday stating that it was very painful and has come in today on his day off. He says that the pain if often better when he is laying down and resting and is worse when walking. [name] reports that he has a history of reoccurring cellulitis over the past year in the same leg and location. He denies chest pain, sob, cough, nausea, vomiting, heart palpitations, headache, abdominal pain, constipation, diarrhea, or burning or pain with urination.   Meds: No current facility-administered medications for this encounter.   Current Outpatient Prescriptions  Medication Sig Dispense Refill  . Multiple Vitamins-Minerals (MULTIVITAMIN PO) Take 1 tablet by mouth daily.      Marland Kitchen. Phenyleph-Doxylamine-DM-APAP (ALKA SELTZER PLUS PO) Take 1 tablet by mouth daily as needed (cold symptoms).      . rizatriptan (MAXALT-MLT) 5 MG disintegrating tablet Take 5 mg by mouth as needed for migraine. May repeat in 2 hours if needed        Allergies: Allergies as of 11/03/2013  . (No Known Allergies)   Past Medical History  Diagnosis  Date  . Hypertension   . Cellulitis    Past Surgical History  Procedure Laterality Date  . Knee surgery     No family history on file. History   Social History  . Marital Status: Single    Spouse Name: N/A    Number of Children: N/A  . Years of Education: N/A   Occupational History  . Not on file.   Social History Main Topics  . Smoking status: Current Every Day Smoker -- 1.00 packs/day  . Smokeless tobacco: Not on file  . Alcohol Use: Yes  . Drug Use: No  . Sexual Activity:    Other Topics Concern  . Not on file   Social History Narrative  . No narrative on file    Review of Systems: Pertinent items are noted in HPI.  Physical Exam: Blood pressure 141/101, pulse 79, temperature 98.6 F (37 C), temperature source Oral, resp. rate 18, height 5\' 7"  (1.702 m), weight 156.689 kg (345 lb 7 oz), SpO2 98.00%.  General: Resting comfortably in bed in no acute distress HEENT: MMM Cardio: RRR, No M/R/G Pulm: Clear to auscultation on the right, slight ronchi heard on upper lobes of the left upper lobe.  Abdominal: Soft, non-tender, normal abdominal bowel sounds Extremities: 2+ pedal and  radial pulses, LLE is warm to touch, erythematous, with non-pitting edema from the ankle to the knee Neuro: Alert and Oriented x3, normal mood and affect.   Lab results: @LABTEST @  Left Lower Extremity Doppler Summary: - No evidence of deep vein thrombosis involving the visualized veins of the left lower extremity. - . Incidental findings are consistent with: enlarged lymph node on the left. - No evidence of Baker's cyst on the left. Other specific details can be found in the table(s) above. Prepared and Electronically Authenticated by   Assessment & Plan by Problem: [name] with history of cellulitis and HTN that presents to our ED with two days of left lower leg swelling, warmth, erythema, and pain.   Active Problems:   Cellulitis   Morbid  obesity  #Cellulitis: Left lower extremity doppler shows no evidence of deep vein thrombosis and no evidence of baker's cyst. Denies chest pain, cough, sob, palpitations. CBC shows WBC of 10.8. LLE shows no signs of trama or clear source of infection.  - Start Vancomycin IV (Pharmacy consulted) - Limb elevation - Morning CBC - Hydrocodone Q4h PRN for pain - Checking A1c given recurrent cellulitis - CIWA protocol  #Hypertension - Uncontrolled: Patient is here with uncontrolled hypertension. He was diagnosed last month at our walk-in clinic and has yet to fill his prescription. His BP on admissions have been 140s-160s/70s-90s. - Start Amlodipine 5mg  - Follow up with PCP for HTN control in outpatient setting  #DVT PPX - Enoxaparin  This is a Psychologist, occupationalMedical Student Note.  The care of the patient was discussed with Dr. Darci Needlehikowski and the assessment and plan was formulated with their assistance.  Please see their note for official documentation of the patient encounter.   Signed: Camille BalJimmy Blakely Maranan, Med Student 11/03/2013, 4:37 PM

## 2013-11-03 NOTE — H&P (Signed)
Date: 11/03/2013               Patient Name:  Timothy Roy MRN: [id number]  DOB: 1976/04/15 Age / Sex: 38 y.o., male   PCP: No Pcp Per Patient         Medical Service: Internal Medicine Teaching Service         Attending Physician: Dr. Aletta Edouard, MD    First Contact: Dr. Darci Needle Pager: [phone]  Second Contact: Dr. Sherrine Maples Pager: [phone]       After Hours (After 5p/  First Contact Pager: [phone]  weekends / holidays): Second Contact Pager: [phone]   Chief Complaint: L leg pain/swelling  History of Present Illness:  This is a 37yo AAM with PMH HTN (noncompliant with medications) who presents with c/o left leg pain, swelling, redness x 2 days. No pain at rest, though has pain w/ palpation and walking. Patient notes that he has had this problem in the past at least 5 times in the last year on the same leg. Each time he was treated with oral antibiotics and sent home with good resolution of symptoms. Per chart review, it appears that patient was only seen once by EDs in our system back in 2013 for LLE cellulitis at which time he was given IV vancomycin and then sent home with doxycycline. Denies F/C, abd pain, N/V/D, CP, SOB, headache, vision changes, polyuria, polydipsia, weight loss. He does have some intermittent blurry vision to b/l eyes, though not occurring today. Of note, patient has been diagnosed with HTN in the past by [facility], but never got the prescriptions (HCTZ and lisinopril) filled.   Meds: No current facility-administered medications for this encounter.   Current Outpatient Prescriptions  Medication Sig Dispense Refill  . Multiple Vitamins-Minerals (MULTIVITAMIN PO) Take 1 tablet by mouth daily.      Marland Kitchen Phenyleph-Doxylamine-DM-APAP (ALKA SELTZER PLUS PO) Take 1 tablet by mouth daily as needed (cold symptoms).      . rizatriptan (MAXALT-MLT) 5 MG disintegrating tablet Take 5 mg by mouth as needed for migraine. May repeat in 2 hours if needed        does not take anything on a daily basis  Allergies: Allergies as of 11/03/2013  . (No Known Allergies)   Past Medical History  Diagnosis Date  . Hypertension   . Cellulitis    Past Surgical History  Procedure Laterality Date  . Knee surgery     No family history on file. History   Social History  . Marital Status: Single    Spouse Name: N/A    Number of Children: N/A  . Years of Education: N/A   Occupational History  . Not on file.   Social History Main Topics  . Smoking status: Current Every Day Smoker -- 1/2 ppd x 15 years  . Smokeless tobacco: Not on file  . Alcohol Use: Yes- 40oz beer and 4-5 shots liquor about every other day  . Drug Use: No  . Sexual Activity:    Other Topics Concern  . Not on file   Social History Narrative  . 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 All other systems negative.  Physical Exam: Blood pressure 141/101, pulse 79, temperature 98.6 F (37 C), temperature source Oral, resp. rate 18, height 5\' 7"  (1.702 m), weight 345 lb 7 oz (156.689 kg), SpO2 98.00%.  General: alert, cooperative, and in no apparent distress HEENT: NCAT, pupils equal round and reactive to light, vision grossly intact, oropharynx clear and non-erythematous  Neck: supple Lungs: clear to ascultation bilaterally, normal work of respiration, no wheezes, rales, ronchi Heart: regular rate and rhythm, no murmurs, gallops, or rubs Abdomen: soft, obese, non-tender, non-distended, normal bowel sounds Extremities:  nonpitting edema to LLE w/ warmth, erythema to L anterior lower leg and some areas of hyperpigmentation; induration mainly present to medial/posterior L lower leg; no drainage or skin breakdown; no areas of fluctuance; b/l feet are diffusely xerotic with abundant callus build up and some splitting in between and around toes; DP pulses intact, but  faint Neurologic: alert & oriented X3, cranial nerves II-XII grossly intact, strength grossly intact, sensation intact to light touch  Lab results: Basic Metabolic Panel:  Recent Labs  45/40/9804/20/15 1204  NA 137  K 3.9  CL 97  CO2 25  GLUCOSE 136*  BUN 22  CREATININE 1.12  CALCIUM 10.9*   CBC:  Recent Labs  11/03/13 1204  WBC 10.8*  NEUTROABS 7.6  HGB 15.1  HCT 42.1  MCV 84.4  PLT 270    Imaging results:  No results found.  LLE duplex-  Summary:  - No evidence of deep vein thrombosis involving the visualized veins of the left lower extremity. - . Incidental findings are consistent with: enlarged lymph node on the left. - No evidence of Baker's cyst on the left. Other specific details can be found in the table(s) above. Prepared and Electronically Authenticated by  Assessment & Plan by Problem:  # Left lower extremity cellulitis:  Pt presents with LLE cellulitis with mild leukocytosis to 10.8. Pt is afebrile w/ rest of VSS, although blood pressure is elevated. LLE doppler negative for PE and pt without risk factors for DVT, he is on his feet "all the time" at work. Patient reports h/o recurrent cellulitis in LLE, ~5 episodes in the last year. Per our records, it looks like [facility] in Oct 2013 at which time he was given IV vanc and discharged home with doxycycline. Given recurrent infections combined with the fact that patient is obese, suspect pt may be either diabetic or prediabetic. Will check A1c and put on SSI overnight. Patient started on Vancomycin in ED. Will continue this for now.  -admit to med surg for observation -Norco for pain -continue vancomycin w/ plan to transition to PO abx tomorrow -SSI -checking A1c -CBC/BMP in AM  # HTN: Pt with prior diagnosis of HTN, though he notes he never filled th medications given to him by the [facility]gent [facility] (HCTZ and lisinopril). Patient's blood pressure is elevated to 141/101 on  admission. Patient not reporting any pain to his foot when he is at rest, so this is not likely the reason for the elevated BP. Will need this addressed as outpatient (currently does not have PCP so we will need to help him find a PCP). -amlodipine 5mg  PO daily now (can start HCTZ or ACEi once we see his kidney function stable)  # heavy alcohol use: Patient admits to drinking approx 40oz beer and 4-5 shots liquor every other day during the week a more than that on the weekends. Last drink this weekend.   -CIWA -supplement with folate, thiamine and MTV  # VTE: lovenox  # Diet: heart healthy  Code status: full  Disposition deferred at this time, awaiting improvement of current medical problems. Anticipated  discharge in approximately 1 day(s).   The patient does not have a current PCP (No Pcp Per Patient) and [facility].  The patient does not have transportation limitations that hinder transportation to clinic appointments.  Signed: Windell Hummingbirdachel Yobana Culliton, MD 11/03/2013, 3:49 PM

## 2013-11-04 ENCOUNTER — Encounter (HOSPITAL_COMMUNITY): Payer: Self-pay | Admitting: General Practice

## 2013-11-04 LAB — BASIC METABOLIC PANEL
BUN: 20 mg/dL (ref 6–23)
CO2: 25 meq/L (ref 19–32)
CREATININE: 0.89 mg/dL (ref 0.50–1.35)
Calcium: 10.3 mg/dL (ref 8.4–10.5)
Chloride: 96 mEq/L (ref 96–112)
GFR calc Af Amer: >90 mL/min (ref >90)
GFR calc non Af Amer: >90 mL/min (ref >90)
Glucose, Bld: 133 mg/dL — ABNORMAL HIGH (ref 70–99)
POTASSIUM: 3.7 meq/L (ref 3.7–5.3)
Sodium: 136 mEq/L — ABNORMAL LOW (ref 137–147)

## 2013-11-04 LAB — CBC
HCT: 39.4 % (ref 39.0–52.0)
Hemoglobin: 14 g/dL (ref 13.0–17.0)
MCH: 30.2 pg (ref 26.0–34.0)
MCHC: 35.5 g/dL (ref 30.0–36.0)
MCV: 85.1 fL (ref 78.0–100.0)
Platelets: 276 10*3/uL (ref 150–400)
RBC: 4.63 MIL/uL (ref 4.22–5.81)
RDW: 14.1 % (ref 11.5–15.5)
WBC: 11.3 10*3/uL — AB (ref 4.0–10.5)

## 2013-11-04 LAB — HEMOGLOBIN A1C
Hgb A1c MFr Bld: 6.3 % — ABNORMAL HIGH (ref <5.7)
Mean Plasma Glucose: 134 mg/dL — ABNORMAL HIGH (ref <117)

## 2013-11-04 LAB — GLUCOSE, CAPILLARY
GLUCOSE-CAPILLARY: 113 mg/dL — AB (ref 70–99)
Glucose-Capillary: 138 mg/dL — ABNORMAL HIGH (ref 70–99)
Glucose-Capillary: 156 mg/dL — ABNORMAL HIGH (ref 70–99)
Glucose-Capillary: 125 mg/dL — ABNORMAL HIGH (ref 70–99)

## 2013-11-04 NOTE — H&P (Signed)
I have seen the patient and reviewed the daily progress note by Camille BalJimmy Chen MS3 and discussed the care of the patient with them.  See my note for documentation of my findings, assessment, and plans.  Coolidge Breezeachel E. Zhanae Proffit Internal Medicine, PGY1 Pager [phone] 11/04/2013, 1:16 PM

## 2013-11-04 NOTE — Progress Notes (Signed)
UR completed. Patient changed to inpatient- requiring IV antibiotics.  

## 2013-11-04 NOTE — Discharge Summary (Signed)
Name: Timothy Roy MRN: [id number] DOB: [birth year] 38 y.o. PCP: No Pcp Per Patient  Date of Admission: 11/03/2013 12:46 PM Date of Discharge: 11/05/2013 Attending Physician: Aletta Edouard, MD  Discharge Diagnosis: Active Problems:   Cellulitis- LLE   Morbid obesity   Hyperglycemia   Heavy alcohol use  Discharge Medications:   Medication List         ALKA SELTZER PLUS PO  Take 1 tablet by mouth daily as needed (cold symptoms).     amLODipine 5 MG tablet  Commonly known as:  NORVASC  Take 1 tablet (5 mg total) by mouth daily.     doxycycline 100 MG tablet  Commonly known as:  VIBRA-TABS  Take 1 tablet (100 mg total) by mouth every 12 (twelve) hours.  Start taking on:  11/06/2013     HYDROcodone-acetaminophen 5-325 MG per tablet  Commonly known as:  NORCO/VICODIN  Take 1-2 tablets by mouth every 4 (four) hours as needed for moderate pain.     MULTIVITAMIN PO  Take 1 tablet by mouth daily.     rizatriptan 5 MG disintegrating tablet  Commonly known as:  MAXALT-MLT  Take 5 mg by mouth as needed for migraine. May repeat in 2 hours if needed       Disposition and follow-up:   Timothy Roy was discharged from [facility] in Stable condition.  At the hospital follow up visit please address:  1.  LLE cellulitis- Patient with recurrent LLE cellulitis; please assess compliance with doxycycline and resolution of symptoms; address good foot care with patient as feet are dry with diffuse skin thickening 2.  Prediabetes- A1c 6.3% this admission; patient will need to establish with PCP; weight loss and lifestyle changes encouraged this admission 3.  HTN- Patient with uncontrolled and untreated HTN at time of admission; we started pt on amlodipine 5mg  daily and discharge pt with a prescription; please assess medication compliance and titrate medications as needed  2.  Labs / imaging needed at time of follow-up: none 3.  Pending labs/ test needing  follow-up: none  Follow-up Appointments: Follow-up Information   Follow up with [facility]    . Schedule an appointment as soon as possible for a visit on 11/12/2013. (walk-in to this clinic at 11am)    Contact information:   [address] [place] [address] [phone]      Discharge Instructions: Discharge Orders   Future Orders Complete By Expires   Call MD for:  redness, tenderness, or signs of infection (pain, swelling, redness, odor or green/yellow discharge around incision site)  As directed    Call MD for:  severe uncontrolled pain  As directed    Call MD for:  temperature >100.4  As directed    Diet - low sodium heart healthy  As directed    Increase activity slowly  As directed       Admission HPI:  This is a 37yo AAM with PMH HTN (noncompliant with medications) who presents with c/o left leg pain, swelling, redness x 2 days. No pain at rest, though has pain w/ palpation and walking. Patient notes that he has had this problem in the past at least 5 times in the last year on the same leg. Each time he was treated with oral antibiotics and sent home with good resolution of symptoms. Per chart review, it appears that patient was only seen once by EDs in our system back in 2013 for LLE  cellulitis at which time he was given IV vancomycin and then sent home with doxycycline. Denies F/C, abd pain, N/V/D, CP, SOB, headache, vision changes, polyuria, polydipsia, weight loss. He does have some intermittent blurry vision to b/l eyes, though not occurring today. Of note, patient has been diagnosed with HTN in the past by [facility], but never got the prescriptions (HCTZ and lisinopril) filled.  Hospital Course by problem list:   # Left lower extremity cellulitis: Timothy Roy presented with erythema, swelling, and pain to LLE. Pt afebrile with rest of VSS. LLE Doppler showed no evidence of deep vein thrombosis and no evidence of baker's cyst.  Pt with mild leukocytosis, WBC count 10.8-->11.3-->11.1. He was started on IV vancomycin (received total of 5 doses) which were transitioned to doxycycline 100mg  BID for a total antibiotic course of 7 days (discharged with 4 more days of doxycyline). Pt's pain well controlled on hydrocodone PRN (required only about 1 norco per day). Of note, patient is prediabetic which is likely contributing to his recurrent LLE cellulitis (~5 episodes in the last few years). On HD2 Timothy Roy showed marked improvement of LLE cellulitis. He was given Norco 5mg /325mg  q4h prn, #10 for pain control at home. I counseled pt to exfoliate his feet w/ pumus stone and use copius amounts of thick emollients to improve the xerosis to b/l feet. I educated patient that his dry, thickened skin is at risk for cracking and may serve as portal of entry for organisms to cause future episodes of cellulitis. Also encouraged weight loss. 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 He was given a work note, which excuses him from work from 4/20-4/26. He will need to follow up with a PCP, given contact information for multiple PCPs in the area and instructed to f/u with PCP of his choosing.   #Hypertension, uncontrolled Timothy Roy has hx of previously diagnosed HTN, though has never gotten the medications (HCTZ and lisinopril) he was given filled. BP on admission was elevated at 141/101. We started pt on amlodipine 5mg  daily during his hospital stay with better control of BP, though still with room for improvement (BP ranged 130s-150s/80s-90s). Patient was discharged on amlodipine 5mg  daily and was counseled to establish himself with a PCP as per above for further HTN management.  #Prediabetes: A1c 6.3% 11/04/13. CBGs ranged 113-156 overnight, required 5U SSI in the 24hrs prior to discharge. Encouraged weight loss and lifestyle modifications. Will need to establish with a primary care doctor as per above.  Heavy alcohol use:  Patient admits to drinking approx 40oz beer and 4-5 shots liquor every other day during the week a more than that on the weekends. Patient given folate, thiamine and MTV as well as monitored on CIWA during his hospitalization, CIWA scores 0. He was instructed to abstain from alcohol use when taking the Norco at home.   Discharge Vitals:   BP 160/82  Pulse 80  Temp(Src) 97.9 F (36.6 C) (Oral)  Resp 20  Ht 5\' 7"  (1.702 m)  Wt 345 lb 7 oz (156.689 kg)  BMI 54.09 kg/m2  SpO2 98%  Discharge Labs:  Results for orders placed during the hospital encounter of 11/03/13 (from the past 24 hour(s))  GLUCOSE, CAPILLARY     Status: Abnormal   Collection Time    11/04/13 12:05 PM      Result Value Ref Range   Glucose-Capillary 113 (*) 70 - 99 mg/dL  GLUCOSE, CAPILLARY  Status: Abnormal   Collection Time    11/04/13  5:24 PM      Result Value Ref Range   Glucose-Capillary 156 (*) 70 - 99 mg/dL  GLUCOSE, CAPILLARY     Status: Abnormal   Collection Time    11/04/13  9:41 PM      Result Value Ref Range   Glucose-Capillary 125 (*) 70 - 99 mg/dL   Comment 1 Notify RN     Comment 2 Documented in Chart    CBC WITH DIFFERENTIAL     Status: Abnormal   Collection Time    11/05/13  3:50 AM      Result Value Ref Range   WBC 11.1 (*) 4.0 - 10.5 K/uL   RBC 4.43  4.22 - 5.81 MIL/uL   Hemoglobin 13.0  13.0 - 17.0 g/dL   HCT 09.838.3 (*) 11.939.0 - 14.752.0 %   MCV 86.5  78.0 - 100.0 fL   MCH 29.3  26.0 - 34.0 pg   MCHC 33.9  30.0 - 36.0 g/dL   RDW 82.914.4  56.211.5 - 13.015.5 %   Platelets 339  150 - 400 K/uL   Neutrophils Relative % 65  43 - 77 %   Lymphocytes Relative 24  12 - 46 %   Monocytes Relative 9  3 - 12 %   Eosinophils Relative 2  0 - 5 %   Basophils Relative 0  0 - 1 %   Neutro Abs 7.2  1.7 - 7.7 K/uL   Lymphs Abs 2.7  0.7 - 4.0 K/uL   Monocytes Absolute 1.0  0.1 - 1.0 K/uL   Eosinophils Absolute 0.2  0.0 - 0.7 K/uL   Basophils Absolute 0.0  0.0 - 0.1 K/uL   WBC Morphology MILD LEFT SHIFT (1-5% METAS,  OCC MYELO, OCC BANDS)    GLUCOSE, CAPILLARY     Status: Abnormal   Collection Time    11/05/13  8:18 AM      Result Value Ref Range   Glucose-Capillary 104 (*) 70 - 99 mg/dL    Signed: Windell Hummingbirdachel Random Dobrowski, MD 11/05/2013, 11:37 AM   Time Spent on Discharge: 35 minutes Services Ordered on Discharge: none Equipment Ordered on Discharge: none

## 2013-11-04 NOTE — H&P (Signed)
INTERNAL MEDICINE TEACHING ATTENDING NOTE  Day 1 of stay  Patient name: Timothy Roy  MRN: [id number] Date of birth: [birth year]   38 y.o. morbidly obese hypertensive (no meds) male with cellulitis of the left calf. The problem has been recurrent over the past years. I met and examined the patient this morning along with med student Jimmy. The patient thought that he was somewhat better. He denies any fever, nausea, vomiting, palpitations, chest pain etc. His left mid-calf is swollen and discolored (chronic from previous cellulitic changes), warm to touch, not tense, with no crepitus. There is no knee pain or swelling. Other systemic exam is not pertinent for abnormal findings.   Reviewed chart, labs and imaging.   For cellulitis - he has been started on Vancomycin IV based on previous treatments and the fact that he has knee hardware. Can be changed to doxycycline PO if clinical improvement. LLE doppler is negative for DVT. Patient has multiple risk factors for recurrent cellulitis including knee hardware, obesity and pre-diabetes.    For hypertension - I agree with starting Amlodipine 5 mg. A diuretic might not be ideal in acutely ill condition, and can make the patient dry ( his recent creatinine was elevated from baseline). ACEi too can be started later, - starting it now might increase Cr and it might be hard to differentiate if the increase is due to ACEi use or a genuine dryness or failure.    I have seen and evaluated this patient and discussed it with my IM resident team.  Please see the rest of the plan per resident note from today.   Keria Widrig 11/04/2013, 9:25 AM.

## 2013-11-04 NOTE — Progress Notes (Signed)
I have seen the patient and reviewed the daily progress note by Jimmy Chen MS3 and discussed the care of the patient with them.  See my note for documentation of my findings, assessment, and plans. 

## 2013-11-04 NOTE — Progress Notes (Signed)
Patient showing increased anxiety and slight agitation regarding feelings of tightness and swelling to left leg, ativan 1 mg given po with vicodin i tab for pain, encouraged and assisted to keep lle elevated above heart to decrease swelling, Berle MullLynn Na Waldrip RN

## 2013-11-04 NOTE — Progress Notes (Signed)
Subjective: Patient feels as though his leg swelling is improving, though the redness is stable. He does not have pain unless he walks on his leg or if he presses on it. He has been elevating his leg overnight. Denies F/C, CP, SOB, abd pain, N/V/D.  Objective: Vital signs in last 24 hours: Filed Vitals:   11/03/13 1730 11/03/13 2110 11/04/13 0005 11/04/13 0511  BP: 124/69 174/85 147/86 139/84  Pulse: 84 86 87 76  Temp:  98.2 F (36.8 C) 98.7 F (37.1 C) 99.2 F (37.3 C)  TempSrc:  Oral Oral Oral  Resp:  18 18 18   Height:      Weight:      SpO2: 95% 93% 93% 98%   Weight change:   Intake/Output Summary (Last 24 hours) at 11/04/13 0835 Last data filed at 11/04/13 0813  Gross per 24 hour  Intake    500 ml  Output      0 ml  Net    500 ml    Physical Exam: General: alert, cooperative, and in no apparent distress HEENT: NCAT, pupils equal round and reactive to light, vision grossly intact, oropharynx clear and non-erythematous  Neck: supple Lungs: clear to ascultation bilaterally, normal work of respiration, no wheezes, rales, ronchi Heart: regular rate and rhythm, no murmurs, gallops, or rubs Abdomen: soft, obese, non-tender, non-distended, normal bowel sounds  Extremities: trace pitting edema to LLE w/ some skin wrinkling and improving erythema to anterior shin from ankle to 2 inches below the knee; hyperpigmentation and skin thickening noted to posterior calf and lower anterior left leg; no edema to RLE; diffusely xerotic skin w/ skin thickening to b/l feet Neurologic: alert & oriented X3, cranial nerves II-XII grossly intact, moving all extremities spontaneously   Lab Results: Basic Metabolic Panel:  Recent Labs Lab 11/03/13 1204 11/04/13 0652  NA 137 136*  K 3.9 3.7  CL 97 96  CO2 25 25  GLUCOSE 136* 133*  BUN 22 20  CREATININE 1.12 0.89  CALCIUM 10.9* 10.3   CBC:  Recent Labs Lab 11/03/13 1204 11/04/13 0652  WBC 10.8* 11.3*  NEUTROABS 7.6  --   HGB  15.1 14.0  HCT 42.1 39.4  MCV 84.4 85.1  PLT 270 276   CBG:  Recent Labs Lab 11/03/13 2108 11/04/13 0745  GLUCAP 184* 138*   Hemoglobin A1C: No results found for this basename: HGBA1C,  in the last 168 hours Fasting Lipid Panel: No results found for this basename: CHOL, HDL, LDLCALC, TRIG, CHOLHDL, LDLDIRECT,  in the last 168 hours  Micro Results: No results found for this or any previous visit (from the past 240 hour(s)). Studies/Results: No results found. Medications: I have reviewed the patient's current medications. Scheduled Meds: . amLODipine  5 mg Oral Daily  . enoxaparin (LOVENOX) injection  80 mg Subcutaneous Q24H  . folic acid  1 mg Oral Daily  . insulin aspart  0-15 Units Subcutaneous TID WC  . insulin aspart  0-5 Units Subcutaneous QHS  . LORazepam  0-4 mg Oral Q6H   Followed by  . [START ON 11/05/2013] LORazepam  0-4 mg Oral Q12H  . multivitamin with minerals  1 tablet Oral Daily  . thiamine  100 mg Oral Daily   Or  . thiamine  100 mg Intravenous Daily  . vancomycin  2,000 mg Intravenous Q12H   Continuous Infusions:  PRN Meds:.HYDROcodone-acetaminophen, LORazepam, LORazepam  Assessment/Plan:  # Left lower extremity cellulitis: Patient feels as though his leg swelling is improving,  though redness is stable. I agree that on physical exam the erythema is approximately the same as yesterday, but the swelling is definitely improving. Pain does not seem to be an issue, last norco given yesterday evening. Pt remained afebrile overnight. Patient with mild leukocytosis with minimal increase in WBC count this morning, WBC 10.8-->11.3. No tachycardic. BP remains elevated. Cr trended down today from 1.12-->.89. CBGs 130s-180s, did not require any SSI. -continue vancomycin IV through the morning dose, then will consider transitioning to doxycycline  -A1c pending -pain well controlled, continue norco prn  # HTN: Patient remains hypertensive overnight, 139-174/80s. Pt did  not receive the amlodipine last night, but is scheduled to start this morning.  -amlodipine 5mg  daily  # heavy alcohol use: Patient with hx of heavy alcohol use. CIWA overnight 0.  -continue CIWA  -supplement with folate, thiamine and MTV   # VTE: lovenox  # Diet: heart healthy  Code status: full  Dispo: Disposition is deferred at this time, awaiting improvement of current medical problems.  Anticipated discharge in approximately 1 day(s).   The patient does not have a current PCP (No Pcp Per Patient) and [facility].  The patient does not have transportation limitations that hinder transportation to clinic appointments.  .Services Needed at time of discharge: Y = Yes, Blank = No PT:   OT:   RN:   Equipment:   Other:     LOS: 1 day   Windell Hummingbirdachel Rafiel Mecca, MD 11/04/2013, 8:35 AM

## 2013-11-04 NOTE — Care Management Note (Signed)
    Page 1 of 1   11/05/2013     12:33:36 PM CARE MANAGEMENT NOTE 11/05/2013  Patient:  Timothy Roy,Timothy Roy   Account Number:  [id number]  Date Initiated:  11/04/2013  Documentation initiated by:  Letha CapeAYLOR,Geena Weinhold  Subjective/Objective Assessment:   dx cellulitis  admit- from home.     Action/Plan:   Anticipated DC Date:  11/05/2013   Anticipated DC Plan:  HOME/SELF CARE      DC Planning Services  CM consult      Choice offered to / List presented to:             Status of service:  Completed, signed off Medicare Important Message given?   (If response is "NO", the following Medicare IM given date fields will be blank) Date Medicare IM given:   Date Additional Medicare IM given:    Discharge Disposition:  HOME/SELF CARE  Per UR Regulation:  Reviewed for med. necessity/level of care/duration of stay  If discussed at Long Length of Stay Meetings, dates discussed:    Comments:  11/05/13 1231 Letha Capeeborah Naiara Lombardozzi RN, BSN [phone] patient for dc  home today, no needs anticipated.  11/04/13 1641 Letha Capeeborah Aaryanna Hyden RN, BSN [phone] patient is from home. NCM will continue to follow for dc needs.

## 2013-11-04 NOTE — Progress Notes (Signed)
Subjective:  [name] had no significant overnight events last night. He reports that he is feeling a little bit better and has not had much pain, but is unable to accurately assess the pain due to the fact that he is not on his feet as often as he had been previously. He denies chest pain, sob, cough, and palpitations.  Objective: Vital signs in last 24 hours: Filed Vitals:   11/03/13 1730 11/03/13 2110 11/04/13 0005 11/04/13 0511  BP: 124/69 174/85 147/86 139/84  Pulse: 84 86 87 76  Temp:  98.2 F (36.8 C) 98.7 F (37.1 C) 99.2 F (37.3 C)  TempSrc:  Oral Oral Oral  Resp:  18 18 18   Height:      Weight:      SpO2: 95% 93% 93% 98%   Weight change:  No intake or output data in the 24 hours ending 11/04/13 0759 Physical Exam: General: Resting comfortably in bed in no acute distress HEENT: PEERL, MMM Neck: Supple, no lymphadenopathy Cardio: RRR, No M/R/G Pulm: Lungs clear to auscultation bilaterally Abdominal: Soft, non-tender, normal abdominal bowel sounds Extremities: 2+ pedal and radial pulses, LLE non-pitting edema, heat, erythema, and tenderness from his ankle going up mid shin. Area of swelling and erythema slightly reduced from yesterday.  Neuro: Alert and Oriented x3  Lab Results: @LABTEST2 @ Micro Results: No results found for this or any previous visit (from the past 240 hour(s)). Studies/Results: No results found. Medications:  Scheduled: . amLODipine  5 mg Oral Daily  . enoxaparin (LOVENOX) injection  80 mg Subcutaneous Q24H  . folic acid  1 mg Oral Daily  . insulin aspart  0-15 Units Subcutaneous TID WC  . insulin aspart  0-5 Units Subcutaneous QHS  . LORazepam  0-4 mg Oral Q6H   Followed by  . [START ON 11/05/2013] LORazepam  0-4 mg Oral Q12H  . multivitamin with minerals  1 tablet Oral Daily  . thiamine  100 mg Oral Daily   Or  . thiamine  100 mg Intravenous Daily  . vancomycin  2,000 mg Intravenous Q12H   Continuous:  YNW:[id number]:HYDROcodone-acetaminophen,  LORazepam, LORazepam Scheduled Meds: . amLODipine  5 mg Oral Daily  . enoxaparin (LOVENOX) injection  80 mg Subcutaneous Q24H  . folic acid  1 mg Oral Daily  . insulin aspart  0-15 Units Subcutaneous TID WC  . insulin aspart  0-5 Units Subcutaneous QHS  . LORazepam  0-4 mg Oral Q6H   Followed by  . [START ON 11/05/2013] LORazepam  0-4 mg Oral Q12H  . multivitamin with minerals  1 tablet Oral Daily  . thiamine  100 mg Oral Daily   Or  . thiamine  100 mg Intravenous Daily  . vancomycin  2,000 mg Intravenous Q12H   Continuous Infusions:  PRN Meds:.HYDROcodone-acetaminophen, LORazepam, LORazepam  Assessment/Plan:[name] with history of cellulitis and HTN that presents to our ED with two days of left lower leg swelling, warmth, erythema, and pain.   Active Problems:   Cellulitis   Morbid obesity   Hyperglycemia   Heavy alcohol use  #Cellulitis: Left lower extremity doppler shows no evidence of deep vein thrombosis and no evidence of baker's cyst. Denies chest pain, cough, sob, palpitations. CBC shows WBC of 10.8-->11.3 today. LLE shows no signs of trama or clear source of infection. Shows improvement in pain today with reduced swelling and erythema from yesterday.  - Continue Vancomycin IV (Pharmacy consulted) and switch to PO doxycycline today -  Limb elevation  - Morning CBC/BMP - Hydrocodone Q4h PRN for pain  - Checking A1c given recurrent cellulitis  - CIWA protocol   #Hypertension - Uncontrolled: Patient is here with uncontrolled hypertension. He was diagnosed last month at our walk-in clinic and has yet to fill his prescription. His BP on admissions have been 140s-160s/70s-90s.  - Start Amlodipine 5mg   - Follow up with PCP for HTN control in outpatient setting   #DVT PPX - Enoxaparin  This is a Psychologist, occupationalMedical Student Note.  The care of the patient was discussed with Dr. Darci Needlehikowski and the assessment and plan formulated with their assistance.  Please see  their attached note for official documentation of the daily encounter.   LOS: 1 day   Camille BalJimmy Blessed Girdner, Med Student 11/04/2013, 7:59 AM

## 2013-11-05 DIAGNOSIS — R7309 Other abnormal glucose: Secondary | ICD-10-CM

## 2013-11-05 DIAGNOSIS — F1010 Alcohol abuse, uncomplicated: Secondary | ICD-10-CM

## 2013-11-05 LAB — CBC WITH DIFFERENTIAL/PLATELET
BASOS PCT: 0 % (ref 0–1)
Basophils Absolute: 0 10*3/uL (ref 0.0–0.1)
EOS ABS: 0.2 10*3/uL (ref 0.0–0.7)
Eosinophils Relative: 2 % (ref 0–5)
HEMATOCRIT: 38.3 % — AB (ref 39.0–52.0)
Hemoglobin: 13 g/dL (ref 13.0–17.0)
Lymphocytes Relative: 24 % (ref 12–46)
Lymphs Abs: 2.7 10*3/uL (ref 0.7–4.0)
MCH: 29.3 pg (ref 26.0–34.0)
MCHC: 33.9 g/dL (ref 30.0–36.0)
MCV: 86.5 fL (ref 78.0–100.0)
Monocytes Absolute: 1 10*3/uL (ref 0.1–1.0)
Monocytes Relative: 9 % (ref 3–12)
NEUTROS ABS: 7.2 10*3/uL (ref 1.7–7.7)
Neutrophils Relative %: 65 % (ref 43–77)
Platelets: 339 10*3/uL (ref 150–400)
RBC: 4.43 MIL/uL (ref 4.22–5.81)
RDW: 14.4 % (ref 11.5–15.5)
WBC: 11.1 10*3/uL — ABNORMAL HIGH (ref 4.0–10.5)

## 2013-11-05 LAB — GLUCOSE, CAPILLARY
GLUCOSE-CAPILLARY: 104 mg/dL — AB (ref 70–99)
GLUCOSE-CAPILLARY: 95 mg/dL (ref 70–99)

## 2013-11-05 MED ORDER — DOXYCYCLINE HYCLATE 100 MG PO TABS
100.0000 mg | ORAL_TABLET | Freq: Two times a day (BID) | ORAL | Status: DC
  Filled 2013-11-05: qty 1

## 2013-11-05 MED ORDER — DOXYCYCLINE HYCLATE 100 MG PO TABS
100.0000 mg | ORAL_TABLET | Freq: Two times a day (BID) | ORAL | Status: DC
  Administered 2013-11-05: 100 mg via ORAL
  Filled 2013-11-05 (×2): qty 1

## 2013-11-05 MED ORDER — DOXYCYCLINE HYCLATE 100 MG PO TABS: 100.0000 mg | ORAL_TABLET | Freq: Two times a day (BID) | ORAL | Status: DC

## 2013-11-05 MED ORDER — AMLODIPINE BESYLATE 5 MG PO TABS: 5.0000 mg | ORAL_TABLET | Freq: Every day | ORAL | Status: DC

## 2013-11-05 MED ORDER — HYDROCODONE-ACETAMINOPHEN 5-325 MG PO TABS: 1.0000 | ORAL_TABLET | ORAL | Status: DC | PRN

## 2013-11-05 NOTE — Discharge Instructions (Signed)
Please continue the doxycycline for 4 more days after discharge (doxycycline 100mg  twice per day).  We started you on amlodipine 5mg  daily for your high blood pressure. Please continue this.  You need to find a PCP to follow up with. We gave you resources (below) in order to help you find a PCP.   Cellulitis Cellulitis is an infection of the skin and the tissue beneath it. The infected area is usually red and tender. Cellulitis occurs most often in the arms and lower legs.  CAUSES  Cellulitis is caused by bacteria that enter the skin through cracks or cuts in the skin. The most common types of bacteria that cause cellulitis are Staphylococcus and Streptococcus. SYMPTOMS   Redness and warmth.  Swelling.  Tenderness or pain.  Fever. DIAGNOSIS  Your caregiver can usually determine what is wrong based on a physical exam. Blood tests may also be done. TREATMENT  Treatment usually involves taking an antibiotic medicine. HOME CARE INSTRUCTIONS   Take your antibiotics as directed. Finish them even if you start to feel better.  Keep the infected arm or leg elevated to reduce swelling.  Apply a warm cloth to the affected area up to 4 times per day to relieve pain.  Only take over-the-counter or prescription medicines for pain, discomfort, or fever as directed by your caregiver.  Keep all follow-up appointments as directed by your caregiver. SEEK MEDICAL CARE IF:   You notice red streaks coming from the infected area.  Your red area gets larger or turns dark in color.  Your bone or joint underneath the infected area becomes painful after the skin has healed.  Your infection returns in the same area or another area.  You notice a swollen bump in the infected area.  You develop new symptoms. SEEK IMMEDIATE MEDICAL CARE IF:   You have a fever.  You feel very sleepy.  You develop vomiting or diarrhea.  You have a general ill feeling (malaise) with muscle aches and pains. MAKE  SURE YOU:   Understand these instructions.  Will watch your condition.  Will get help right away if you are not doing well or get worse. Document Released: 04/12/2005 Document Revised: 01/02/2012 Document Reviewed: 09/18/2011 [facility] Patient Information 2014 [place], [place]. Hypertension As your heart beats, it forces blood through your arteries. This force is your blood pressure. If the pressure is too high, it is called hypertension (HTN) or high blood pressure. HTN is dangerous because you may have it and not know it. High blood pressure may mean that your heart has to work harder to pump blood. Your arteries may be narrow or stiff. The extra work puts you at risk for heart disease, stroke, and other problems.  Blood pressure consists of two numbers, a higher number over a lower, 110/72, for example. It is stated as "110 over 72." The ideal is below 120 for the top number (systolic) and under 80 for the bottom (diastolic). Write down your blood pressure today. You should pay close attention to your blood pressure if you have certain conditions such as:  Heart failure.  Prior heart attack.  Diabetes  Chronic kidney disease.  Prior stroke.  Multiple risk factors for heart disease. To see if you have HTN, your blood pressure should be measured while you are seated with your arm held at the level of the heart. It should be measured at least twice. A one-time elevated blood pressure reading (especially in the Emergency Department) does not mean that you  need treatment. There may be conditions in which the blood pressure is different between your right and left arms. It is important to see your caregiver soon for a recheck. Most people have essential hypertension which means that there is not a specific cause. This type of high blood pressure may be lowered by changing lifestyle factors such as:  Stress.  Smoking.  Lack of exercise.  Excessive weight.  Drug/tobacco/alcohol  use.  Eating less salt. Most people do not have symptoms from high blood pressure until it has caused damage to the body. Effective treatment can often prevent, delay or reduce that damage. TREATMENT  When a cause has been identified, treatment for high blood pressure is directed at the cause. There are a large number of medications to treat HTN. These fall into several categories, and your caregiver will help you select the medicines that are best for you. Medications may have side effects. You should review side effects with your caregiver. If your blood pressure stays high after you have made lifestyle changes or started on medicines,   Your medication(s) may need to be changed.  Other problems may need to be addressed.  Be certain you understand your prescriptions, and know how and when to take your medicine.  Be sure to follow up with your caregiver within the time frame advised (usually within two weeks) to have your blood pressure rechecked and to review your medications.  If you are taking more than one medicine to lower your blood pressure, make sure you know how and at what times they should be taken. Taking two medicines at the same time can result in blood pressure that is too low. SEEK IMMEDIATE MEDICAL CARE IF:  You develop a severe headache, blurred or changing vision, or confusion.  You have unusual weakness or numbness, or a faint feeling.  You have severe chest or abdominal pain, vomiting, or breathing problems. MAKE SURE YOU:   Understand these instructions.  Will watch your condition.  Will get help right away if you are not doing well or get worse. Document Released: 07/03/2005 Document Revised: 09/25/2011 Document Reviewed: 02/21/2008 [facility] Patient Information 2014 [place], [place].   Emergency Department Resource Guide 1) Find a Doctor and Pay Out of Pocket Although you won't have to find out who is covered by your insurance plan, it is a good idea to ask  around and get recommendations. You will then need to call the office and see if the doctor you have chosen will accept you as a new patient and what types of options they offer for patients who are self-pay. Some doctors offer discounts or will set up payment plans for their patients who do not have insurance, but you will need to ask so you aren't surprised when you get to your appointment.  2) Contact Your Local Health Department Not all health departments have doctors that can see patients for sick visits, but many do, so it is worth a call to see if yours does. If you don't know where your local health department is, you can check in your phone book. The CDC also has a tool to help you locate your state's health department, and many state websites also have listings of all of their local health departments.  3) Find a Walk-in Clinic If your illness is not likely to be very severe or complicated, you may want to try a walk in clinic. These are popping up all over the country in pharmacies, drugstores, and shopping centers.  They're usually staffed by nurse practitioners or physician assistants that have been trained to treat common illnesses and complaints. They're usually fairly quick and inexpensive. However, if you have serious medical issues or chronic medical problems, these are probably not your best option.  No Primary Care Doctor: - Call Health Connect at  [phone] - they can help you locate a primary care doctor that  accepts your insurance, provides certain services, etc. - Physician Referral Service- [phone]  Chronic Pain Problems: Organization         Address  Phone   Notes  [facility]  [phone] Patients need to be referred by their primary care doctor.   Medication Assistance: Organization         Address  Phone   Notes  [facility] Medication [facility] [address] [place], [place] [address] [phone] --Must be a  resident of [place] [facility] [place] -- Must have NO insurance coverage whatsoever (no Medicaid/ Medicare, etc.) -- The pt. MUST have a primary care doctor that directs their care regularly and follows them in the community   MedAssist  [phone]   [employer]  [phone]    Agencies that provide inexpensive medical care: Organization         Address  Phone   Notes  [name]  [phone]   [name]    [phone]   [facility] [address] [place], [place] [address] [phone]) [phone]   [facility] [address], [place] [phone]) [phone]   Planned Parenthood    [phone]   [facility]    [phone]) [phone]   Community [facility] and [facility]  [address], [place] Phone:  [phone], Fax:  [phone]) [phone] Hours of Operation:  9 am - 6 pm, M-F.  Also accepts Medicaid/Medicare and self-pay.  [facility]  [address], [place] Phone: [phone], Fax: [phone]. Hours of Operation:  8:30 am - 5:30 pm, M-F.  Also accepts Medicaid and self-pay.  [facility] [place] [address], [place] Phone: [phone]) [phone]   [facility] [address], [place], [place] [phone], Ext. [phone] Mondays & Thursdays: 7-9 AM.  First 15 patients are seen on a first come, first serve basis.    Medicaid-accepting [facility] Providers:  Organization         Address  Phone   Notes  [facility] [address], [place] [phone] Also accepts self-pay patients.  [facility] [address], [place]  [phone]) [phone]   [facility] [address], [place] [phone]   [facility] [address] [place] [address], [place] [phone]   Renaye RakersVeita Bland [address], [place]   [phone] Only accepts  [place] Access [place] patients after they have their name applied to their card.   Self-Pay (no insurance) in [facility]:  Organization         Address  Phone   Notes  Sickle Cell Patients, [facility] [address], [place] [phone]   [facility] [address], [place] [phone]) [phone]   [name] [place]  [address], [place] [phone]  [facility]/Dr. Osei-Bonsu  [address] [place] or [address], [place] [phone] Phone number for both [place] and [place] locations is the same.  [facility] [address] [place] [phone]   [facility] [address] [place] or [address] [phone]   [facility] [address] [place] [phone], phone; [phone], fax Sees patients 1st and 3rd Saturday of every month.  Must not qualify for public or private insurance (i.e. Medicaid, Medicare, [place] Health Choice, Veterans' Benefits)  Household income should be no more than 200% of the poverty level The clinic cannot treat you if you are pregnant or think you are pregnant  Sexually transmitted diseases are not treated at the clinic.    Dental Care: Organization         Address  Phone  Notes  [facility] Department of [facility] [phone] Accepts children up to age 30 who are enrolled in [place] or [place]; pregnant women with a Medicaid card; and children who have applied for Medicaid or [place] Health Choice, but were declined, whose parents can pay a reduced fee at time of service.  [facility] Department of [facility]  [address], [place] [phone] Accepts children up to age 58 who are enrolled in [place] or [place]; pregnant women  with a Medicaid card; and children who have applied for Medicaid or [place] Health Choice, but were declined, whose parents can pay a reduced fee at time of service.  [facility] Adult [facility] Access PROGRAM  [place] [phone] Patients are seen by appointment only. Walk-ins are not accepted. [place] will see patients 53 years of age and older. Monday - Tuesday (8am-5pm) Most Wednesdays (8:30-5pm) $30 per visit, cash only  [facility] Adult Dental Access PROGRAM  [address], [facility] [phone] Patients are seen by appointment only. Walk-ins are not accepted. [place] will see patients 74 years of age and older. One Wednesday Evening (Monthly: Volunteer Based).  $30 per visit, cash only  [facility]  [phone] for adults; Children under age 66, call Graduate Pediatric Dentistry at [phone]. Children aged 37-14, please call [phone] to request a pediatric application.  Dental services are provided in all areas of dental care including fillings, crowns and bridges, complete and partial dentures, implants, gum treatment, root canals, and extractions. Preventive care is also provided. Treatment is provided to both adults and children. Patients are selected via a lottery and there is often a waiting list.   [facility] [address] [place]  [phone] [email or web address]   [facility] [address] [place], [place] [phone], Ext. [phone] Second and Fourth Thursday of each month, opens at 6:30 AM; Clinic ends at 9 AM.  Patients are seen on a first-come first-served basis, and a limited number are seen during each clinic.   [facility]  [address] Hillard Danker [place], [place] [phone]   Eligibility Requirements You must have lived in [place], [place], or [place] counties for at least the last three months.   You cannot be eligible for state or federal sponsored [employer], including [employer], [place], or [employer].   You generally cannot be eligible for healthcare insurance  through your employer.    How to apply: Eligibility screenings are held every Tuesday and Wednesday afternoon from 1:00 pm until 4:00 pm. You do not need an appointment for the interview!  [facility] [address], [place], [place]   [facility]  [place] Department  [place]  [phone]    Behavioral Health Resources in the Community: Intensive Outpatient Programs Organization         Address  Phone  Notes  [facility] [facility]. [address] [place], [place] [phone]   [facility]patient [address], [place], [place]   ADS: Alcohol & Drug Svcs [address] [place], [place]   [place] [address]  [place], [place] or [phone]   Substance Abuse Resources Organization         Address  Phone  Notes  Alcohol and Drug Services  [phone]   [place]  [phone]   The [place]   Chinita Pester  [phone]   Residential & Outpatient Substance Abuse Program  [phone]   Psychological Services Organization         Address  Phone  Notes  [facility]  [place]  [phone]   [place] [address], [place] or [phone]    Mobile Crisis Teams Organization         Address  Phone  Notes  [facility], Mobile Crisis Care Unit  [phone]   [facility]  [address] [place], [place]   [place] [address], [place] [phone]    Self-Help/Support Groups Organization         Address  Phone             Notes  [place] [facility] [place] - variety of support groups  [place] Call for more information  Narcotics Anonymous (NA), [facility] [address], [employer] [place]  2 meetings at this location   Special educational needs teacher         Address  Phone  Notes  ASAP Residential Treatment [place],    [place]  [phone]   [facility]  [address], [place] [phone], [place], [place]   [place], [place] [phone] Admissions: 8am-3pm M-F  Incentives Substance [place] [address]    [place], [place] [phone]   [facility] [address] [place], [place], [place]   [facility] [address]  [place], [place]   [facility] Programs - Intensive Outpatient [address], [place], [place]   [facility] ([place].) [place].,  [place], [place] [phone] or [phone]   [facility] (RTS) [address], [place], [place] Accepts Medicaid  [facility] [place] [address],  [place] [phone] Substance Abuse/Addiction Treatment   [facility] Organization         Address  Phone  Notes  [facility]  [phone]   Domenic Schwab, PhD [address] [place], [place]   [phone] or [phone]   [place], [place] [phone]  [facility] [address] [place], [place] [phone] Insurance/Medicaid/sponsorship through [employer] and Families [address] Ste [place], [place] [phone] [place], [place] [phone]    Dr. Adele Schilder  [phone]   [facility] Dept. 1) [address],  [place] 2) [place] 3)  [place] 65, [place] [phone]  [phone]   [place] [phone] or [phone] (After Hours)

## 2013-11-05 NOTE — Progress Notes (Signed)
Subjective: Patient feels much improved this morning. The pain, redness, and swelling are much improved compared to yesterday. He has some pain with walking on his leg, but otherwise no pain. Requiring only about 1 norco pill per day. Denies F/C, CP, SOB, abd pain, N/V/D.  Objective: Vital signs in last 24 hours: Filed Vitals:   11/04/13 1700 11/04/13 1806 11/04/13 2144 11/05/13 0551  BP: 147/85 157/91 144/87 133/86  Pulse: 85 75 81 77  Temp:  98.2 F (36.8 C) 98.6 F (37 C) 98.5 F (36.9 C)  TempSrc:  Oral Oral Oral  Resp:  20 18 20   Height:      Weight:      SpO2:  99% 97% 97%   Weight change:   Intake/Output Summary (Last 24 hours) at 11/05/13 0929 Last data filed at 11/04/13 2100  Gross per 24 hour  Intake    600 ml  Output      0 ml  Net    600 ml    Physical Exam: General: alert, cooperative, and in no apparent distress HEENT: NCAT, pupils equal round and reactive to light, vision grossly intact, oropharynx clear and non-erythematous  Neck: supple Lungs: CTAB Heart: RRR Abdomen: soft, obese, non-tender, non-distended, normal bowel sounds  Extremities: no pedal edema; erythema to LLE much improved; skin wrinkling noted to LLE; diffusely xerotic skin to b/l feet Neurologic: alert & oriented X3, cranial nerves II-XII grossly intact, moving all extremities spontaneously   Lab Results: Basic Metabolic Panel:  Recent Labs Lab 11/03/13 1204 11/04/13 0652  NA 137 136*  K 3.9 3.7  CL 97 96  CO2 25 25  GLUCOSE 136* 133*  BUN 22 20  CREATININE 1.12 0.89  CALCIUM 10.9* 10.3   CBC:  Recent Labs Lab 11/03/13 1204 11/04/13 0652 11/05/13 0350  WBC 10.8* 11.3* 11.1*  NEUTROABS 7.6  --  7.2  HGB 15.1 14.0 13.0  HCT 42.1 39.4 38.3*  MCV 84.4 85.1 86.5  PLT 270 276 339   CBG:  Recent Labs Lab 11/03/13 2108 11/04/13 0745 11/04/13 1205 11/04/13 1724 11/04/13 2141 11/05/13 0818  GLUCAP 184* 138* 113* 156* 125* 104*   Hemoglobin A1C:  Recent  Labs Lab 11/04/13 0652  HGBA1C 6.3*   Medications: I have reviewed the patient's current medications. Scheduled Meds: . amLODipine  5 mg Oral Daily  . doxycycline  100 mg Oral Q12H  . enoxaparin (LOVENOX) injection  80 mg Subcutaneous Q24H  . folic acid  1 mg Oral Daily  . insulin aspart  0-15 Units Subcutaneous TID WC  . insulin aspart  0-5 Units Subcutaneous QHS  . LORazepam  0-4 mg Oral Q6H   Followed by  . LORazepam  0-4 mg Oral Q12H  . multivitamin with minerals  1 tablet Oral Daily  . thiamine  100 mg Oral Daily   Or  . thiamine  100 mg Intravenous Daily   Continuous Infusions:  PRN Meds:.HYDROcodone-acetaminophen, LORazepam, LORazepam  Assessment/Plan:  # Left lower extremity cellulitis: Improved dramatically. S/p 5 doses IV vancomycin. Some continued pain with ambulation, otherwise no pain. VSS. WBC count downtrended slightly from 11.3-->11.1. 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 I wrote him a work note to excuse him from  4/20-4/26. Encouraged leg elevation at home. Also counseled him to exfoliate his feet w/ pumus stone and use copius amounts of thick emollients to improve the xerosis to b/l feet. I educated patient that his dry, thickened skin is  at risk for cracking and may serve as portal of entry for organisms to cause future episodes of cellulitis. Also encouraged weight loss. -transition to doxycycline 100mg  PO BID, will receive first dose prior to discharge -pain well controlled, will give norco 5-325mg  q4h prn, #10 to go home with as he will likely have pain with increased ambulation  # HTN: Patient's blood pressure more well controlled overnight, ranged 130-150/80-90s. S/p 1 dose of amlodipine. Will continue this at discharge. He will need to establish with a PCP. He was given the information for the [facility] as well as a list of other PCPs in the area.   -amlodipine 5mg  daily  #Prediabetes: A1c 6.3% 11/04/13. CBGs  ranged 113-156 overnight, required 5U SSI in last 24hrs. -encouraged weight loss and lifestyle modifications -will need to establish with a primary care doctor  # heavy alcohol use: Patient with hx of heavy alcohol use. CIWA overnight 0. Instructed to abstain from alcohol use when taking the Norco at home. -continue CIWA  -supplement with folate, thiamine and MTV   # VTE: lovenox  # Diet: heart healthy  Code status: full  Dispo: Discharge today.   The patient does not have a current PCP (No Pcp Per Patient) and does need [facility] appointment after discharge.  The patient does not have transportation limitations that hinder transportation to clinic appointments.  .Services Needed at time of discharge: Y = Yes, Blank = No PT:   OT:   RN:   Equipment:   Other:     LOS: 2 days   Windell Hummingbirdachel Nayelis Bonito, MD 11/05/2013, 9:29 AM

## 2013-11-05 NOTE — Progress Notes (Signed)
Subjective:  [name] reports doing better overnight with decreased pain on ambulation and less swelling. He denies chest pain, sob, palpitations. He reports having headache overnight due to waking up often by nursing staff.  Objective: Vital signs in last 24 hours: Filed Vitals:   11/04/13 1806 11/04/13 2144 11/05/13 0551 11/05/13 1013  BP: 157/91 144/87 133/86 160/82  Pulse: 75 81 77 80  Temp: 98.2 F (36.8 C) 98.6 F (37 C) 98.5 F (36.9 C) 97.9 F (36.6 C)  TempSrc: Oral Oral Oral Oral  Resp: 20 18 20 20   Height:      Weight:      SpO2: 99% 97% 97% 98%   Weight change:   Intake/Output Summary (Last 24 hours) at 11/05/13 1031 Last data filed at 11/05/13 0900  Gross per 24 hour  Intake    718 ml  Output      0 ml  Net    718 ml   Physical Exam:  General: Resting comfortably in bed in no acute distress  HEENT: PEERL, MMM  Neck: Supple, no lymphadenopathy  Cardio: RRR, No M/R/G  Pulm: Lungs clear to auscultation bilaterally  Abdominal: Soft, non-tender, normal abdominal bowel sounds  Extremities: 2+ pedal and radial pulses, LLE non-pitting edema, heat, erythema, and tenderness from his ankle going up mid shin. Area of swelling and erythema reduced from yesterday.  Neuro: Alert and Oriented x3  Lab Results: @LABTEST2 @ Micro Results: No results found for this or any previous visit (from the past 240 hour(s)). Studies/Results: No results found. Medications:  Scheduled: . amLODipine  5 mg Oral Daily  . doxycycline  100 mg Oral Q12H  . enoxaparin (LOVENOX) injection  80 mg Subcutaneous Q24H  . folic acid  1 mg Oral Daily  . insulin aspart  0-15 Units Subcutaneous TID WC  . insulin aspart  0-5 Units Subcutaneous QHS  . LORazepam  0-4 mg Oral Q6H   Followed by  . LORazepam  0-4 mg Oral Q12H  . multivitamin with minerals  1 tablet Oral Daily  . thiamine  100 mg Oral Daily   Continuous:  ZOX:[id number]:HYDROcodone-acetaminophen, LORazepam, LORazepam Scheduled Meds: .  amLODipine  5 mg Oral Daily  . doxycycline  100 mg Oral Q12H  . enoxaparin (LOVENOX) injection  80 mg Subcutaneous Q24H  . folic acid  1 mg Oral Daily  . insulin aspart  0-15 Units Subcutaneous TID WC  . insulin aspart  0-5 Units Subcutaneous QHS  . LORazepam  0-4 mg Oral Q6H   Followed by  . LORazepam  0-4 mg Oral Q12H  . multivitamin with minerals  1 tablet Oral Daily  . thiamine  100 mg Oral Daily   Or  . thiamine  100 mg Intravenous Daily   Continuous Infusions:  PRN Meds:.HYDROcodone-acetaminophen, LORazepam, LORazepam  Assessment/Plan: [name] with history of cellulitis and HTN that presents to our ED with two days of left lower leg swelling, warmth, erythema, and pain.   Active Problems:   Cellulitis   Morbid obesity   Hyperglycemia   Heavy alcohol use  #Cellulitis: Left lower extremity doppler shows no evidence of deep vein thrombosis and no evidence of baker's cyst. Denies chest pain, cough, sob, palpitations. CBC shows WBC of 10.8-->11.3 today. LLE shows no signs of trama or clear source of infection. Shows improvement in pain today with reduced swelling and erythema from yesterday.  - Switch to PO doxycycline today and D/C with 4 more days of  doxycycline 100 mg BID - Limb elevation  - Hydrocodone Q4h PRN for pain  - A1c of 6.3%   - CIWA protocol   #Hypertension - Uncontrolled: Patient is here with uncontrolled hypertension. He was diagnosed last month at our walk-in clinic and has yet to fill his prescription. His BP on admissions have been 140s-160s/70s-90s.  - Start Amlodipine 5mg  BID - D/C on amlodipine 5mg  BID - Follow up with PCP for HTN control in outpatient setting   #DVT PPX - Enoxaparin  This is a Psychologist, occupationalMedical Student Note.  The care of the patient was discussed with Dr. Darci Needlehikowski and the assessment and plan formulated with their assistance.  Please see their attached note for official documentation of the daily encounter.   LOS: 2  days   Camille BalJimmy Clent Damore, Med Student 11/05/2013, 10:31 AM

## 2013-11-05 NOTE — Progress Notes (Signed)
    Day 2 of stay      Patient name: Marin OlpChristopher Gladhill  Medical record number: [id number]  Date of birth: [birth year]  Seen and evaluated patient with the IM team this morning. Doing much better. Less pain on walking. Vitals stable, on exam, the left leg swelling appears reduced and erythema is lessening. His Musolino count is trending down, however is still mildly elevated.    Recent Labs Lab 11/03/13 1204 11/04/13 0652 11/05/13 0350  HGB 15.1 14.0 13.0  HCT 42.1 39.4 38.3*  WBC 10.8* 11.3* 11.1*  PLT 270 276 339    We will change antibiotics from IV Vanco to PO doxy to which he has responded in the past and look forward to discharge him today after one dose. He has been educated about outpatient follow up and he voices understanding.   I have discussed the care of this patient with my team Dr Sherrine MaplesGlenn, Dr Quincy Sheehanhikowsky and Dr Mariea ClontsEmokpae.    Shanieka Blea 11/05/2013, 12:05 PM.

## 2013-11-05 NOTE — Progress Notes (Signed)
NURSING PROGRESS NOTE  Timothy OlpChristopher Roy [id number] Discharge Data: 11/05/2013 3:51 PM Attending Provider: Aletta EdouardShilpa Bhardwaj, MD PCP:No PCP Per Patient     Timothy Roy to be D/C'd Home per MD order.  Discussed with the patient the After Visit Summary and all questions fully answered. All IV's discontinued with no bleeding noted. All belongings returned to patient for patient to take home.   Last Vital Signs:  Blood pressure 147/89, pulse 76, temperature 98.4 F (36.9 C), temperature source Oral, resp. rate 20, height 5\' 7"  (1.702 m), weight 156.689 kg (345 lb 7 oz), SpO2 96.00%.  Discharge Medication List   Medication List         ALKA SELTZER PLUS PO  Take 1 tablet by mouth daily as needed (cold symptoms).     amLODipine 5 MG tablet  Commonly known as:  NORVASC  Take 1 tablet (5 mg total) by mouth daily.     doxycycline 100 MG tablet  Commonly known as:  VIBRA-TABS  Take 1 tablet (100 mg total) by mouth every 12 (twelve) hours.  Start taking on:  11/06/2013     HYDROcodone-acetaminophen 5-325 MG per tablet  Commonly known as:  NORCO/VICODIN  Take 1-2 tablets by mouth every 4 (four) hours as needed for moderate pain.     MULTIVITAMIN PO  Take 1 tablet by mouth daily.     rizatriptan 5 MG disintegrating tablet  Commonly known as:  MAXALT-MLT  Take 5 mg by mouth as needed for migraine. May repeat in 2 hours if needed

## 2013-11-05 NOTE — Progress Notes (Signed)
I have seen the patient and reviewed the daily progress note by Jimmy Chen MS3 and discussed the care of the patient with them.  See my note for documentation of my findings, assessment, and plans. 

## 2013-11-06 NOTE — Discharge Summary (Signed)
Reviewed. Agree with documentation. 

## 2013-11-10 ENCOUNTER — Emergency Department (INDEPENDENT_AMBULATORY_CARE_PROVIDER_SITE_OTHER)
Admission: EM | Admit: 2013-11-10 | Discharge: 2013-11-10 | Disposition: A | Discharge status: Home / Self Care | Payer: 59 | Source: Home / Self Care

## 2013-11-10 ENCOUNTER — Encounter (HOSPITAL_COMMUNITY): Payer: Self-pay | Admitting: Emergency Medicine

## 2013-11-10 DIAGNOSIS — L02419 Cutaneous abscess of limb, unspecified: Secondary | ICD-10-CM

## 2013-11-10 DIAGNOSIS — R600 Localized edema: Secondary | ICD-10-CM

## 2013-11-10 DIAGNOSIS — L03119 Cellulitis of unspecified part of limb: Secondary | ICD-10-CM

## 2013-11-10 DIAGNOSIS — L03116 Cellulitis of left lower limb: Secondary | ICD-10-CM

## 2013-11-10 DIAGNOSIS — R609 Edema, unspecified: Secondary | ICD-10-CM

## 2013-11-10 NOTE — ED Provider Notes (Signed)
Medical screening examination/treatment/procedure(s) were performed by resident physician or non-physician practitioner and as supervising physician I was immediately available for consultation/collaboration.   KINDL,JAMES DOUGLAS MD.   James D Kindl, MD 11/10/13 2104 

## 2013-11-10 NOTE — ED Notes (Signed)
Reports was seen in hospital on 4/20 for left leg swelling and redness.  Still having pain and swelling with standing/walking.  Pt is taking meds as prescribed but feels like its not working.

## 2013-11-10 NOTE — Discharge Instructions (Signed)
Cellulitis Cellulitis is an infection of the skin and the tissue under the skin. The infected area is usually red and tender. This happens most often in the arms and lower legs. HOME CARE   Take your antibiotic medicine as told. Finish the medicine even if you start to feel better.  Keep the infected arm or leg raised (elevated).  Put a warm cloth on the area up to 4 times per day.  Only take medicines as told by your doctor.  Keep all doctor visits as told. GET HELP RIGHT AWAY IF:   You have a fever.  You feel very sleepy.  You throw up (vomit) or have watery poop (diarrhea).  You feel sick and have muscle aches and pains.  You see red streaks on the skin coming from the infected area.  Your red area gets bigger or turns a dark color.  Your bone or joint under the infected area is painful after the skin heals.  Your infection comes back in the same area or different area.  You have a puffy (swollen) bump in the infected area.  You have new symptoms. MAKE SURE YOU:   Understand these instructions.  Will watch your condition.  Will get help right away if you are not doing well or get worse. Document Released: 12/20/2007 Document Revised: 01/02/2012 Document Reviewed: 09/18/2011 [facility] Patient Information 2014 [place], [place].  Edema Edema is a buildup of fluids. It is most common in the feet, ankles, and legs. This happens more as a person ages. It may affect one or both legs. HOME CARE   Raise (elevate) the legs or ankles above the level of the heart while lying down.  Avoid sitting or standing still for a long time.  Exercise the legs to help the puffiness (swelling) go down.  A low-salt diet may help lessen the puffiness.  Only take medicine as told by your doctor. GET HELP RIGHT AWAY IF:   You develop shortness of breath or chest pain.  You cannot breathe when you lie down.  You have more puffiness that does not go away with treatment.  You  develop pain or redness in the areas that are puffy.  You have a temperature by mouth above 102 F (38.9 C), not controlled by medicine.  You gain 03 lb/1.4 kg or more in 1 day or 05 lb/2.3 kg in a week. MAKE SURE YOU:   Understand these instructions.  Will watch your condition.  Will get help right away if you are not doing well or get worse. Document Released: 12/20/2007 Document Revised: 09/25/2011 Document Reviewed: 12/20/2007 [facility]Exit[facility] Patient Information 2014 [place], [place].  Peripheral Edema You have swelling in your legs (peripheral edema). This swelling is due to excess accumulation of salt and water in your body. Edema may be a sign of heart, kidney or liver disease, or a side effect of a medication. It may also be due to problems in the leg veins. Elevating your legs and using special support stockings may be very helpful, if the cause of the swelling is due to poor venous circulation. Avoid long periods of standing, whatever the cause. Treatment of edema depends on identifying the cause. Chips, pretzels, pickles and other salty foods should be avoided. Restricting salt in your diet is almost always needed. Water pills (diuretics) are often used to remove the excess salt and water from your body via urine. These medicines prevent the kidney from reabsorbing sodium. This increases urine flow. Diuretic treatment may also result  in lowering of potassium levels in your body. Potassium supplements may be needed if you have to use diuretics daily. Daily weights can help you keep track of your progress in clearing your edema. You should call your caregiver for follow up care as recommended. SEEK IMMEDIATE MEDICAL CARE IF:   You have increased swelling, pain, redness, or heat in your legs.  You develop shortness of breath, especially when lying down.  You develop chest or abdominal pain, weakness, or fainting.  You have a fever. Document Released: 08/10/2004 Document Revised:  09/25/2011 Document Reviewed: 07/21/2009 [facility] CenterExitCare Patient Information 2014 [place], [place].

## 2013-11-10 NOTE — ED Provider Notes (Signed)
CSN: [id number]     Arrival date & time 11/10/13  1237 History   First MD Initiated Contact with Patient 11/10/13 1430     Chief Complaint  Patient presents with  . Leg Pain   (Consider location/radiation/quality/duration/timing/severity/associated sxs/prior Treatment) HPI Comments: 38 y o severely obese male with a recent hx of L lower leg cellulitis for which he was admitted 1 week ago for IV ABX. He had improved and d/c home with po doxy BID and norco. He presents today wanting to know why he continues to have swelling and pain when he is up and about; improves substantially when elevated and off weight. He st not worse overall but a little better. He has an appt in 2 d with the internal med dept for F/U.    Past Medical History  Diagnosis Date  . Hypertension   . Cellulitis    Past Surgical History  Procedure Laterality Date  . Knee surgery     History reviewed. No pertinent family history. History  Substance Use Topics  . Smoking status: Current Every Day Smoker -- 1.00 packs/day for 15 years    Types: Cigarettes  . Smokeless tobacco: Never Used     Comment: HAS USED VAPOR CIG  . Alcohol Use: Yes     Comment: SOCIAL    Review of Systems  Constitutional: Positive for activity change. Negative for fever.  Respiratory: Negative.   Cardiovascular: Negative.   Musculoskeletal:       As per HPI  Neurological: Negative.     Allergies  Review of patient's allergies indicates no known allergies.  Home Medications   Prior to Admission medications   Medication Sig Start Date End Date Taking? Authorizing Provider  amLODipine (NORVASC) 5 MG tablet Take 1 tablet (5 mg total) by mouth daily. 11/05/13  Yes Windell Hummingbirdachel Chikowski, MD  doxycycline (VIBRA-TABS) 100 MG tablet Take 1 tablet (100 mg total) by mouth every 12 (twelve) hours. 11/06/13  Yes Windell Hummingbirdachel Chikowski, MD  Multiple Vitamins-Minerals (MULTIVITAMIN PO) Take 1 tablet by mouth daily.   Yes Historical Provider, MD   Phenyleph-Doxylamine-DM-APAP (ALKA SELTZER PLUS PO) Take 1 tablet by mouth daily as needed (cold symptoms).   Yes Historical Provider, MD  rizatriptan (MAXALT-MLT) 5 MG disintegrating tablet Take 5 mg by mouth as needed for migraine. May repeat in 2 hours if needed   Yes Historical Provider, MD  HYDROcodone-acetaminophen (NORCO/VICODIN) 5-325 MG per tablet Take 1-2 tablets by mouth every 4 (four) hours as needed for moderate pain. 11/05/13   Windell Hummingbirdachel Chikowski, MD   BP 141/97  Pulse 80  Temp(Src) 98.2 F (36.8 C) (Oral)  Resp 20  SpO2 97% Physical Exam  Nursing note and vitals reviewed. Constitutional: He is oriented to person, place, and time.  Severely and morbidly obese  Pulmonary/Chest: Effort normal. No respiratory distress.  Musculoskeletal: He exhibits edema and tenderness.  L lower leg with edema, portions with 1-2+ pitting edema. Darkened discolorations. Mild erythema. Not changed per pt. No lymphangitis . No edema extending beyond the level when discharged. Pedal pulse 3+. .  Neurological: He is alert and oriented to person, place, and time.  Skin: Skin is warm and dry.  Psychiatric: He has a normal mood and affect.    ED Course  Procedures (including critical care time) Labs Review Labs Reviewed - No data to display  Imaging Review No results found.   MDM   1. Cellulitis of leg, left   2. Leg edema, left  No progression of erythema or edema. improves with elevation, worse with standing. Affected by severe obesity and smoking.  Keep appointment 2 days to maintain continuity of care. Doubt necessary to send to ED now and pt refused the suggestion since he st has not worsened, just not as improved as he expected.  For any worsening go to the ED. Explained to pt.    Hayden Rasmussenavid Trenden Hazelrigg, NP 11/10/13 1520

## 2014-04-16 ENCOUNTER — Other Ambulatory Visit: Payer: Self-pay | Admitting: Family Medicine

## 2014-04-16 DIAGNOSIS — M79605 Pain in left leg: Secondary | ICD-10-CM

## 2014-04-16 DIAGNOSIS — R609 Edema, unspecified: Secondary | ICD-10-CM

## 2014-04-17 ENCOUNTER — Ambulatory Visit
Admission: RE | Admit: 2014-04-17 | Discharge: 2014-04-17 | Disposition: A | Discharge status: Home / Self Care | Payer: 59 | Source: Ambulatory Visit | Attending: Family Medicine | Admitting: Family Medicine

## 2014-04-17 DIAGNOSIS — R609 Edema, unspecified: Secondary | ICD-10-CM

## 2014-04-17 DIAGNOSIS — M79605 Pain in left leg: Secondary | ICD-10-CM

## 2014-06-18 ENCOUNTER — Other Ambulatory Visit: Payer: Self-pay | Admitting: Family Medicine

## 2014-06-18 ENCOUNTER — Ambulatory Visit
Admission: RE | Admit: 2014-06-18 | Discharge: 2014-06-18 | Disposition: A | Discharge status: Home / Self Care | Payer: 59 | Source: Ambulatory Visit | Attending: Family Medicine | Admitting: Family Medicine

## 2014-06-18 DIAGNOSIS — M25571 Pain in right ankle and joints of right foot: Secondary | ICD-10-CM

## 2014-06-18 DIAGNOSIS — M25562 Pain in left knee: Secondary | ICD-10-CM

## 2016-07-31 ENCOUNTER — Emergency Department (HOSPITAL_COMMUNITY): Payer: Self-pay

## 2016-07-31 ENCOUNTER — Encounter (HOSPITAL_COMMUNITY): Payer: Self-pay | Admitting: Emergency Medicine

## 2016-07-31 ENCOUNTER — Emergency Department (HOSPITAL_COMMUNITY)
Admission: EM | Admit: 2016-07-31 | Discharge: 2016-07-31 | Disposition: A | Discharge status: Home / Self Care | Payer: Self-pay | Attending: Emergency Medicine | Admitting: Emergency Medicine

## 2016-07-31 DIAGNOSIS — M25469 Effusion, unspecified knee: Secondary | ICD-10-CM

## 2016-07-31 DIAGNOSIS — M1712 Unilateral primary osteoarthritis, left knee: Secondary | ICD-10-CM

## 2016-07-31 DIAGNOSIS — Y929 Unspecified place or not applicable: Secondary | ICD-10-CM | POA: Insufficient documentation

## 2016-07-31 DIAGNOSIS — X501XXA Overexertion from prolonged static or awkward postures, initial encounter: Secondary | ICD-10-CM | POA: Insufficient documentation

## 2016-07-31 DIAGNOSIS — F17210 Nicotine dependence, cigarettes, uncomplicated: Secondary | ICD-10-CM | POA: Insufficient documentation

## 2016-07-31 DIAGNOSIS — Y939 Activity, unspecified: Secondary | ICD-10-CM | POA: Insufficient documentation

## 2016-07-31 DIAGNOSIS — Y990 Civilian activity done for income or pay: Secondary | ICD-10-CM | POA: Insufficient documentation

## 2016-07-31 DIAGNOSIS — M25562 Pain in left knee: Secondary | ICD-10-CM | POA: Insufficient documentation

## 2016-07-31 DIAGNOSIS — Z79899 Other long term (current) drug therapy: Secondary | ICD-10-CM | POA: Insufficient documentation

## 2016-07-31 DIAGNOSIS — M25569 Pain in unspecified knee: Secondary | ICD-10-CM

## 2016-07-31 DIAGNOSIS — I10 Essential (primary) hypertension: Secondary | ICD-10-CM | POA: Insufficient documentation

## 2016-07-31 MED ORDER — DICLOFENAC SODIUM 1 % TD GEL: 4.0000 g | Freq: Four times a day (QID) | TRANSDERMAL | 0 refills | Status: DC | PRN

## 2016-07-31 MED ORDER — AMLODIPINE BESYLATE 5 MG PO TABS: 5.0000 mg | ORAL_TABLET | Freq: Every day | ORAL | 1 refills | Status: DC
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
## 2016-07-31 NOTE — Discharge Instructions (Addendum)
You have been seen today for chronic knee pain. You have evidence of arthritis in the knee. This is likely the cause of your pain. This is a chronic problem and will need evaluation by an orthopedic surgeon. Call the number provided to set up an appointment. May take ibuprofen or naproxen to reduce pain and inflammation. Alternatively, you may apply the diclofenac gel directly to the knee. You may take one of these medications, but do not combine them.  You were also noted to have high blood pressure today. It is very important that you select and follow up with a primary care provider for management of this issue. You have been given a renewed prescription for your Norvasc today, but this will need to be reevaluated soon.

## 2016-07-31 NOTE — ED Notes (Signed)
Applied knee sleeve

## 2016-07-31 NOTE — ED Notes (Signed)
Papers reviewed with patient and knee sleeve applied. States he wants to walk out and try the knee sleeve. Patient also advised to follow up with PCP about his BP
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
## 2016-07-31 NOTE — ED Notes (Signed)
Informed Shawn - PA of pt's BP.

## 2016-11-22 ENCOUNTER — Encounter (HOSPITAL_COMMUNITY): Payer: Self-pay | Admitting: *Deleted

## 2016-11-22 ENCOUNTER — Emergency Department (HOSPITAL_COMMUNITY)
Admission: EM | Admit: 2016-11-22 | Discharge: 2016-11-22 | Disposition: A | Discharge status: Home / Self Care | Payer: Self-pay | Attending: Emergency Medicine | Admitting: Emergency Medicine

## 2016-11-22 DIAGNOSIS — Z79899 Other long term (current) drug therapy: Secondary | ICD-10-CM | POA: Insufficient documentation

## 2016-11-22 DIAGNOSIS — M25561 Pain in right knee: Secondary | ICD-10-CM | POA: Insufficient documentation

## 2016-11-22 DIAGNOSIS — I160 Hypertensive urgency: Secondary | ICD-10-CM

## 2016-11-22 DIAGNOSIS — F17210 Nicotine dependence, cigarettes, uncomplicated: Secondary | ICD-10-CM | POA: Insufficient documentation

## 2016-11-22 DIAGNOSIS — I10 Essential (primary) hypertension: Secondary | ICD-10-CM | POA: Insufficient documentation

## 2016-11-22 HISTORY — DX: Obesity, unspecified: E66.9

## 2016-11-22 LAB — I-STAT CHEM 8, ED
BUN: 7 mg/dL (ref 6–20)
CALCIUM ION: 1.29 mmol/L (ref 1.15–1.40)
CREATININE: 0.7 mg/dL (ref 0.61–1.24)
Chloride: 104 mmol/L (ref 101–111)
GLUCOSE: 69 mg/dL (ref 65–99)
HEMATOCRIT: 38 % — AB (ref 39.0–52.0)
HEMOGLOBIN: 12.9 g/dL — AB (ref 13.0–17.0)
Potassium: 4 mmol/L (ref 3.5–5.1)
Sodium: 140 mmol/L (ref 135–145)
TCO2: 26 mmol/L (ref 0–100)

## 2016-11-22 MED ORDER — PREDNISONE 20 MG PO TABS: 40.0000 mg | ORAL_TABLET | Freq: Every day | ORAL | 0 refills | Status: DC

## 2016-11-22 MED ORDER — PREDNISONE 20 MG PO TABS
60.0000 mg | ORAL_TABLET | ORAL | Status: AC
  Administered 2016-11-22: 60 mg via ORAL
  Filled 2016-11-22: qty 3

## 2016-11-22 MED ORDER — HYDROCODONE-ACETAMINOPHEN 5-325 MG PO TABS: 1.0000 | ORAL_TABLET | Freq: Four times a day (QID) | ORAL | 0 refills | Status: DC | PRN

## 2016-11-22 MED ORDER — AMLODIPINE BESYLATE 5 MG PO TABS: 5.0000 mg | ORAL_TABLET | Freq: Every day | ORAL | 0 refills | Status: DC

## 2016-11-22 NOTE — ED Provider Notes (Signed)
[facility]-EMERGENCY DEPT Provider Note   CSN: [id number] Arrival date & time: 11/22/16  1139  By signing my name below, I, Deland Pretty, attest that this documentation has been prepared under the direction and in the presence of Gerhard Munch, MD. Electronically Signed: Deland Pretty, ED Scribe. 11/22/16. 12:46 PM.    History   Chief Complaint Chief Complaint  Patient presents with  . Knee Pain  . Hypertension     The history is provided by the patient. No language interpreter was used.   HPI Comments: Timothy Roy is a 41 y.o. male presents to the Emergency Department complaining of gradually worsening right knee pain and swelling that began 3 weeks ago. He denies recent falls or injuries. The pt indicates that the pain is exacerbated by knee flexion and valgus. He has been taking tylenol with inadequate relief of his symptoms. Pt has prior h/o fracture to the right knee years ago. He has never seen an orthopedist and has never had similar problems with the knee. 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 He denies numbness.   He additionally complains of elevated BP readings at home around 128/119. He states he does not currently take medication for HTN as he recently had a change in job and insurance and no longer has a PCP. He is a former smoker of 2 months and drinks intermittently. Pt denies chest pain, vomiting, and vision loss. He has no known drug allergies and denies other complaints.  Past Medical History:  Diagnosis Date  . Cellulitis   . Hypertension   . Obesity     Patient Active Problem List   Diagnosis Date Noted  . Morbid obesity (HCC) 11/03/2013  . Hyperglycemia 11/03/2013  . Heavy alcohol use 11/03/2013  . Cellulitis     Past Surgical History:  Procedure Laterality Date  . knee surgery         Home Medications    Prior to Admission medications   Medication Sig Start Date End Date Taking? Authorizing Provider  amLODipine (NORVASC) 5  MG tablet Take 1 tablet (5 mg total) by mouth daily. 11/05/13   Coolidge Breeze, MD  amLODipine (NORVASC) 5 MG tablet Take 1 tablet (5 mg total) by mouth daily. 07/31/16   Joy, Shawn C, PA-C  diclofenac sodium (VOLTAREN) 1 % GEL Apply 4 g topically 4 (four) times daily as needed (for knee pain). 07/31/16   Joy, Shawn C, PA-C  doxycycline (VIBRA-TABS) 100 MG tablet Take 1 tablet (100 mg total) by mouth every 12 (twelve) hours. 11/06/13   Coolidge Breeze, MD  HYDROcodone-acetaminophen (NORCO/VICODIN) 5-325 MG per tablet Take 1-2 tablets by mouth every 4 (four) hours as needed for moderate pain. 11/05/13   Coolidge Breeze, MD  Multiple Vitamins-Minerals (MULTIVITAMIN PO) Take 1 tablet by mouth daily.    [provider]  Phenyleph-Doxylamine-DM-APAP (ALKA SELTZER PLUS PO) Take 1 tablet by mouth daily as needed (cold symptoms).    [provider]  rizatriptan (MAXALT-MLT) 5 MG disintegrating tablet Take 5 mg by mouth as needed for migraine. May repeat in 2 hours if needed    [provider]    Family History History reviewed. No pertinent family history.  Social History Social History  Substance Use Topics  . Smoking status: Current Every Day Smoker    Packs/day: 1.00    Years: 15.00    Types: Cigarettes  . Smokeless tobacco: Never Used     Comment: HAS USED VAPOR CIG  .  Alcohol use Yes     Comment: SOCIAL     Allergies   Patient has no known allergies.   Review of Systems Review of Systems  Constitutional:       Per HPI, otherwise negative  HENT:       Per HPI, otherwise negative  Eyes: Negative for visual disturbance.  Respiratory:       Per HPI, otherwise negative  Cardiovascular: Negative for chest pain.       Per HPI, otherwise negative  Gastrointestinal: Negative for vomiting.  Endocrine:       Negative aside from HPI  Genitourinary:       Neg aside from HPI   Musculoskeletal: Positive for arthralgias and joint swelling.       Per  HPI, otherwise negative  Skin: Negative.   Neurological: Negative for syncope and numbness.   Physical Exam Updated Vital Signs BP (!) 150/124 (BP Location: Left Arm)   Pulse 74   Temp 98.3 F (36.8 C) (Oral)   Resp 18   Ht 5\' 6"  (1.676 m)   Wt (!) 319 lb (144.7 kg)   SpO2 99%   BMI 51.49 kg/m   Physical Exam  Constitutional: He is oriented to person, place, and time. He appears well-developed. No distress.  HENT:  Head: Normocephalic and atraumatic.  Eyes: Conjunctivae and EOM are normal.  Cardiovascular: Normal rate and regular rhythm.   Pulmonary/Chest: Effort normal. No stridor. No respiratory distress.  Abdominal: He exhibits no distension.  Musculoskeletal: He exhibits no edema.       Right knee: He exhibits decreased range of motion, swelling and effusion. He exhibits no ecchymosis, no deformity, no laceration, no erythema, normal alignment and no LCL laxity. Tenderness found. Medial joint line tenderness noted.       Right ankle: Normal.  Neurological: He is alert and oriented to person, place, and time.  Skin: Skin is warm and dry.  Psychiatric: He has a normal mood and affect.  Nursing note and vitals reviewed.    ED Treatments / Results   DIAGNOSTIC STUDIES: Oxygen Saturation is 99% on RA, normal by my interpretation.   COORDINATION OF CARE: 12:35 PM-Discussed next steps with pt. Pt verbalized understanding and is agreeable with the plan.    Labs (all labs ordered are listed, but only abnormal results are displayed) Labs Reviewed  I-STAT CHEM 8, ED - Abnormal; Notable for the following:       Result Value   Hemoglobin 12.9 (*)    HCT 38.0 (*)    All other components within normal limits    EKG SR69, partial RBBB, abnormal   Procedures Procedures (including critical care time)  Medications Ordered in ED Medications  predniSONE (DELTASONE) tablet 60 mg (not administered)     Initial Impression / Assessment and Plan / ED Course  I have  reviewed the triage vital signs and the nursing notes.  Pertinent labs & imaging results that were available during my care of the patient were reviewed by me and considered in my medical decision making (see chart for details).     On repeat exam patient is in no distress. We discussed all findings at length, and the need to follow-up with primary care, and orthopedics. With reassuring findings, no evidence for septic arthritis, no evidence for gout, there suspicion for soft tissue injury, likely meniscal, given the patient's swelling, decreased range of motion, absence of fever, and superficial changes, risk factors. Patient does have elevated blood pressure, but  no evidence for end organ damage. Patient started on home medication regimen.   Final Clinical Impressions(s) / ED Diagnoses   Final diagnoses:  Acute pain of right knee  Hypertensive urgency    New Prescriptions New Prescriptions   PREDNISONE (DELTASONE) 20 MG TABLET    Take 2 tablets (40 mg total) by mouth daily with breakfast. For the next four days    I personally performed the services described in this documentation, which was scribed in my presence. The recorded information has been reviewed and is accurate.      Gerhard Munch, MD 11/22/16 [phone]

## 2016-11-22 NOTE — ED Triage Notes (Signed)
Pt reports right knee pain x 3 weeks. Hx of left knee pain. Denies injury to right knee. Hypertensive at triage. Reports no bp meds since at least January.

## 2017-06-03 ENCOUNTER — Encounter (HOSPITAL_COMMUNITY): Payer: Self-pay

## 2017-06-03 ENCOUNTER — Emergency Department (HOSPITAL_BASED_OUTPATIENT_CLINIC_OR_DEPARTMENT_OTHER): Admit: 2017-06-03 | Discharge: 2017-06-03 | Disposition: A | Discharge status: Home / Self Care | Payer: Self-pay

## 2017-06-03 ENCOUNTER — Emergency Department (HOSPITAL_COMMUNITY)
Admission: EM | Admit: 2017-06-03 | Discharge: 2017-06-03 | Disposition: A | Discharge status: Home / Self Care | Payer: Self-pay | Attending: Physician Assistant | Admitting: Physician Assistant

## 2017-06-03 DIAGNOSIS — I10 Essential (primary) hypertension: Secondary | ICD-10-CM | POA: Insufficient documentation

## 2017-06-03 DIAGNOSIS — F17210 Nicotine dependence, cigarettes, uncomplicated: Secondary | ICD-10-CM | POA: Insufficient documentation

## 2017-06-03 DIAGNOSIS — R2242 Localized swelling, mass and lump, left lower limb: Secondary | ICD-10-CM | POA: Insufficient documentation

## 2017-06-03 DIAGNOSIS — M7989 Other specified soft tissue disorders: Secondary | ICD-10-CM

## 2017-06-03 DIAGNOSIS — R600 Localized edema: Secondary | ICD-10-CM

## 2017-06-03 DIAGNOSIS — Z79899 Other long term (current) drug therapy: Secondary | ICD-10-CM | POA: Insufficient documentation

## 2017-06-03 MED ORDER — AMLODIPINE BESYLATE 5 MG PO TABS: 5.0000 mg | ORAL_TABLET | Freq: Every day | ORAL | 0 refills | Status: DC

## 2017-06-03 MED ORDER — CEPHALEXIN 500 MG PO CAPS: 500.0000 mg | ORAL_CAPSULE | Freq: Four times a day (QID) | ORAL | 0 refills | Status: DC

## 2017-06-03 NOTE — Progress Notes (Signed)
VASCULAR LAB PRELIMINARY  PRELIMINARY  PRELIMINARY  PRELIMINARY  Left lower extremitiy venous duplex completed.    Preliminary report:  There is no DVT or SVT noted in the left lower extremity.  There is interstitial fluid noted throughout the calf.  Non thrombosed varicosities noted throughout left calf.  Gave report to Dr. Allie DimmerMackuen  Thessaly Mccullers, [facility], RVT 06/03/2017, 6:08 PM

## 2017-06-03 NOTE — ED Provider Notes (Signed)
[facility] EMERGENCY DEPARTMENT Provider Note   CSN: [id number] Arrival date & time: 06/03/17  1507     History   Chief Complaint Chief Complaint  Patient presents with  . Leg Swelling    HPI Timothy Roy is a 41 y.o. male.  HPI   Very friendly 41 year old African-American male presenting today with left lower extremity swelling.  Patient reports both legs been swollen since he stopped his blood pressure medication 2 months ago.  He reports that he ran out of the prescriptions.  He had been diagnosed with sinusitis not leg previously, however today there is no warmth redness.  He does have mild pain.  No shortness breath or other concerns.  Past Medical History:  Diagnosis Date  . Cellulitis   . Hypertension   . Obesity     Patient Active Problem List   Diagnosis Date Noted  . Morbid obesity (HCC) 11/03/2013  . Hyperglycemia 11/03/2013  . Heavy alcohol use 11/03/2013  . Cellulitis     Past Surgical History:  Procedure Laterality Date  . knee surgery         Home Medications    Prior to Admission medications   Medication Sig Start Date End Date Taking? Authorizing Provider  amLODipine (NORVASC) 5 MG tablet Take 1 tablet (5 mg total) by mouth daily. 11/22/16   Gerhard MunchLockwood, Robert, MD  diclofenac sodium (VOLTAREN) 1 % GEL Apply 4 g topically 4 (four) times daily as needed (for knee pain). 07/31/16   Joy, Shawn C, PA-C  HYDROcodone-acetaminophen (NORCO/VICODIN) 5-325 MG tablet Take 1 tablet by mouth every 6 (six) hours as needed for moderate pain. 11/22/16   Gerhard MunchLockwood, Robert, MD  Multiple Vitamins-Minerals (MULTIVITAMIN PO) Take 1 tablet by mouth daily.    [provider]  Phenyleph-Doxylamine-DM-APAP (ALKA SELTZER PLUS PO) Take 1 tablet by mouth daily as needed (cold symptoms).    [provider]  predniSONE (DELTASONE) 20 MG tablet Take 2 tablets (40 mg total) by mouth daily with breakfast. For the next four days 11/22/16    Gerhard MunchLockwood, Robert, MD  rizatriptan (MAXALT-MLT) 5 MG disintegrating tablet Take 5 mg by mouth as needed for migraine. May repeat in 2 hours if needed    [provider]    Family History No family history on file.  Social History Social History   Tobacco Use  . Smoking status: Current Every Day Smoker    Packs/day: 1.00    Years: 15.00    Pack years: 15.00    Types: Cigarettes  . Smokeless tobacco: Never Used  . Tobacco comment: HAS USED VAPOR CIG  Substance Use Topics  . Alcohol use: Yes    Comment: SOCIAL  . Drug use: No     Allergies   Patient has no known allergies.   Review of Systems Review of Systems  Constitutional: Negative for activity change.  Respiratory: Negative for shortness of breath.   Cardiovascular: Positive for leg swelling. Negative for chest pain.  Gastrointestinal: Negative for abdominal pain.     Physical Exam Updated Vital Signs BP (!) 189/116 (BP Location: Right Arm)   Pulse 81   Temp 98.9 F (37.2 C) (Oral)   Resp 16   Ht 5\' 6"  (1.676 m)   Wt (!) 156.9 kg (346 lb)   SpO2 97%   BMI 55.85 kg/m   Physical Exam  Constitutional: He is oriented to person, place, and time. He appears well-nourished.  HENT:  Head: Normocephalic.  Eyes: Conjunctivae are normal.  Right eye exhibits no discharge. Left eye exhibits no discharge.  Cardiovascular: Normal rate.  Pulmonary/Chest: Effort normal.  Musculoskeletal: He exhibits edema.  Edema bilaterally.  No warmth erythema in either limb.  Neurological: He is oriented to person, place, and time.  Skin: Skin is warm and dry. He is not diaphoretic.  Psychiatric: He has a normal mood and affect. His behavior is normal.     ED Treatments / Results  Labs (all labs ordered are listed, but only abnormal results are displayed) Labs Reviewed - No data to display  EKG  EKG Interpretation None       Radiology No results found.  Procedures Procedures (including critical care  time)  Medications Ordered in ED Medications - No data to display   Initial Impression / Assessment and Plan / ED Course  I have reviewed the triage vital signs and the nursing notes.  Pertinent labs & imaging results that were available during my care of the patient were reviewed by me and considered in my medical decision making (see chart for details).      Very friendly 41 year old African-American male presenting today with left lower extremity swelling.  Patient reports both legs been swollen since he stopped his blood pressure medication 2 months ago.  He reports that he ran out of the prescriptions.  He had been diagnosed with sinusitis not leg previously, however today there is no warmth redness.  He does have mild pain.  No shortness breath or other concerns.   4:54 PM Will get ultrasound of left leg to rule out DVT.  Since patient is concerned that it is more swollen than usual.  Will give month-long prescriptions of his blood pressure medication and phone number for providers that take uninsured.   Final Clinical Impressions(s) / ED Diagnoses   Final diagnoses:  None    ED Discharge Orders    None       Abelino DerrickMackuen, Seena Face Lyn, MD 06/03/17 1805

## 2017-06-03 NOTE — Discharge Instructions (Addendum)
We see no evidence of blood clot in your leg.  It is possible that you are ealy in developing infection.  However currently there is no infection.  If he gets worse, you have redness, you could take the medication provided.  We will also give you 1 month supply of your hypertensive medication.  Please call the number below to try to find a provider that can see you without insurance.  To find a primary care or specialty doctor please call [phone] or [phone] to access "[facility] Find a Doctor Service."  You may also go on the [facility] website at [email or web address]  There are also multiple [facility] and [facility] practices throughout the [place] that are frequently accepting new patients. You may find a clinic that is close to your home and contact them.  [facility] -  [address] [place] [phone]  [facility] in [place] (also locations in [place] and [place]) -  [address] [place] [address] [phone]  [facility] -  [address] [place] [address] [phone]

## 2017-06-03 NOTE — ED Triage Notes (Addendum)
Per Pt, Pt is coming from home with complaints of swelling and tightness in his left leg. Hx of Hypertension with some leg swelling, but he reports that this leg has never had this tight feeling before. Pt has been out of BP medications for two months.

## 2017-06-17 ENCOUNTER — Other Ambulatory Visit: Payer: Self-pay

## 2017-06-17 ENCOUNTER — Encounter (HOSPITAL_COMMUNITY): Payer: Self-pay | Admitting: *Deleted

## 2017-06-17 DIAGNOSIS — Z79899 Other long term (current) drug therapy: Secondary | ICD-10-CM | POA: Insufficient documentation

## 2017-06-17 DIAGNOSIS — I10 Essential (primary) hypertension: Secondary | ICD-10-CM | POA: Insufficient documentation

## 2017-06-17 DIAGNOSIS — M79605 Pain in left leg: Secondary | ICD-10-CM | POA: Insufficient documentation

## 2017-06-17 DIAGNOSIS — R2242 Localized swelling, mass and lump, left lower limb: Secondary | ICD-10-CM | POA: Insufficient documentation

## 2017-06-17 DIAGNOSIS — F17210 Nicotine dependence, cigarettes, uncomplicated: Secondary | ICD-10-CM | POA: Insufficient documentation

## 2017-06-17 DIAGNOSIS — R609 Edema, unspecified: Secondary | ICD-10-CM | POA: Insufficient documentation

## 2017-06-17 NOTE — ED Triage Notes (Signed)
The pt has had lt ankle pain and swelling since 11-15  No known injury  His pain and swelling is no better

## 2017-06-18 ENCOUNTER — Emergency Department (HOSPITAL_COMMUNITY)
Admission: EM | Admit: 2017-06-18 | Discharge: 2017-06-18 | Disposition: A | Discharge status: Home / Self Care | Payer: Self-pay | Attending: Emergency Medicine | Admitting: Emergency Medicine

## 2017-06-18 ENCOUNTER — Emergency Department (HOSPITAL_BASED_OUTPATIENT_CLINIC_OR_DEPARTMENT_OTHER): Admit: 2017-06-18 | Discharge: 2017-06-18 | Disposition: A | Discharge status: Home / Self Care | Payer: Self-pay

## 2017-06-18 DIAGNOSIS — R609 Edema, unspecified: Secondary | ICD-10-CM

## 2017-06-18 LAB — COMPREHENSIVE METABOLIC PANEL
ALBUMIN: 4 g/dL (ref 3.5–5.0)
ALT: 13 U/L — AB (ref 17–63)
AST: 16 U/L (ref 15–41)
Alkaline Phosphatase: 89 U/L (ref 38–126)
Anion gap: 6 (ref 5–15)
BUN: 11 mg/dL (ref 6–20)
CHLORIDE: 103 mmol/L (ref 101–111)
CO2: 28 mmol/L (ref 22–32)
CREATININE: 0.75 mg/dL (ref 0.61–1.24)
Calcium: 10.2 mg/dL (ref 8.9–10.3)
GFR calc Af Amer: >60 mL/min (ref >60)
GLUCOSE: 90 mg/dL (ref 65–99)
Potassium: 3.6 mmol/L (ref 3.5–5.1)
Sodium: 137 mmol/L (ref 135–145)
Total Bilirubin: 0.6 mg/dL (ref 0.3–1.2)
Total Protein: 7.6 g/dL (ref 6.5–8.1)

## 2017-06-18 LAB — CBC WITH DIFFERENTIAL/PLATELET
BASOS ABS: 0 10*3/uL (ref 0.0–0.1)
BASOS PCT: 0 %
EOS ABS: 0.3 10*3/uL (ref 0.0–0.7)
EOS PCT: 6 %
HEMATOCRIT: 42.5 % (ref 39.0–52.0)
Hemoglobin: 14.1 g/dL (ref 13.0–17.0)
Lymphocytes Relative: 51 %
Lymphs Abs: 2.8 10*3/uL (ref 0.7–4.0)
MCH: 28.3 pg (ref 26.0–34.0)
MCHC: 33.2 g/dL (ref 30.0–36.0)
MCV: 85.3 fL (ref 78.0–100.0)
MONO ABS: 0.5 10*3/uL (ref 0.1–1.0)
MONOS PCT: 8 %
NEUTROS ABS: 2 10*3/uL (ref 1.7–7.7)
Neutrophils Relative %: 35 %
PLATELETS: 278 10*3/uL (ref 150–400)
RBC: 4.98 MIL/uL (ref 4.22–5.81)
RDW: 13.9 % (ref 11.5–15.5)
WBC: 5.5 10*3/uL (ref 4.0–10.5)

## 2017-06-18 MED ORDER — HYDROCHLOROTHIAZIDE 25 MG PO TABS: 25.0000 mg | ORAL_TABLET | Freq: Every day | ORAL | 0 refills | Status: DC

## 2017-06-18 NOTE — Progress Notes (Signed)
VASCULAR LAB PRELIMINARY  PRELIMINARY  PRELIMINARY  PRELIMINARY  Left lower extremity venous duplex completed.    Preliminary report:  There is no obvious evidence of DVT or SVT noted in the visualized veins of the left lower extremity.   Called report to patients provider.  Alexzandra Bilton, RVT 06/18/2017, 8:00 AM   ;

## 2017-06-18 NOTE — ED Provider Notes (Signed)
[facility] EMERGENCY DEPARTMENT Provider Note   CSN: [id number] Arrival date & time: 06/17/17  2040     History   Chief Complaint Chief Complaint  Patient presents with  . Ankle Pain    HPI Timothy Roy is a 41 y.o. male.  41 yo M with a chief complaint of left leg pain and swelling.  This been going on for the past month or so.  Was seen in the ED about a month ago had a ultrasound that was negative for a DVT and then was started on Keflex for possible cellulitis.  Was also given amlodipine for hypertension.  Since then the leg swelling improved transiently and then worsened again.  The patient is on his feet for work.  He denies injury to the skin.  Denies fevers nausea or vomiting.  Denies rash.  Has had transient swelling in that leg off and on for years.  Had a prior surgery to his left knee.  Denies other surgeries to that leg.   The history is provided by the patient.  Ankle Pain    Illness  This is a recurrent problem. The current episode started more than 1 week ago. The problem occurs constantly. The problem has not changed since onset.Pertinent negatives include no chest pain, no abdominal pain, no headaches and no shortness of breath. Nothing aggravates the symptoms. Nothing relieves the symptoms. He has tried nothing for the symptoms. The treatment provided no relief.    Past Medical History:  Diagnosis Date  . Cellulitis   . Hypertension   . Obesity     Patient Active Problem List   Diagnosis Date Noted  . Morbid obesity (HCC) 11/03/2013  . Hyperglycemia 11/03/2013  . Heavy alcohol use 11/03/2013  . Cellulitis     Past Surgical History:  Procedure Laterality Date  . knee surgery         Home Medications    Prior to Admission medications   Medication Sig Start Date End Date Taking? Authorizing Provider  cephALEXin (KEFLEX) 500 MG capsule Take 1 capsule (500 mg total) 4 (four) times daily by mouth. 06/03/17   Mackuen,  Courteney Lyn, MD  diclofenac sodium (VOLTAREN) 1 % GEL Apply 4 g topically 4 (four) times daily as needed (for knee pain). 07/31/16   Joy, Shawn C, PA-C  hydrochlorothiazide (HYDRODIURIL) 25 MG tablet Take 1 tablet (25 mg total) by mouth daily. 06/18/17   Melene Plan, DO  HYDROcodone-acetaminophen (NORCO/VICODIN) 5-325 MG tablet Take 1 tablet by mouth every 6 (six) hours as needed for moderate pain. 11/22/16   Gerhard Munch, MD  Multiple Vitamins-Minerals (MULTIVITAMIN PO) Take 1 tablet by mouth daily.    [provider]  Phenyleph-Doxylamine-DM-APAP (ALKA SELTZER PLUS PO) Take 1 tablet by mouth daily as needed (cold symptoms).    [provider]  predniSONE (DELTASONE) 20 MG tablet Take 2 tablets (40 mg total) by mouth daily with breakfast. For the next four days 11/22/16   Gerhard Munch, MD  rizatriptan (MAXALT-MLT) 5 MG disintegrating tablet Take 5 mg by mouth as needed for migraine. May repeat in 2 hours if needed    [provider]    Family History No family history on file.  Social History Social History   Tobacco Use  . Smoking status: Current Every Day Smoker    Packs/day: 1.00    Years: 15.00    Pack years: 15.00    Types: Cigarettes  . Smokeless tobacco: Never Used  . Tobacco  comment: HAS USED VAPOR CIG  Substance Use Topics  . Alcohol use: Yes    Comment: SOCIAL  . Drug use: No     Allergies   Patient has no known allergies.   Review of Systems Review of Systems  Constitutional: Negative for chills and fever.  HENT: Negative for congestion and facial swelling.   Eyes: Negative for discharge and visual disturbance.  Respiratory: Negative for shortness of breath.   Cardiovascular: Positive for leg swelling. Negative for chest pain and palpitations.  Gastrointestinal: Negative for abdominal pain, diarrhea and vomiting.  Musculoskeletal: Negative for arthralgias and myalgias.  Skin: Negative for color change and rash.  Neurological:  Negative for tremors, syncope and headaches.  Psychiatric/Behavioral: Negative for confusion and dysphoric mood.     Physical Exam Updated Vital Signs BP (!) 179/76 (BP Location: Left Arm)   Pulse 76   Temp 98.5 F (36.9 C) (Oral)   Resp 15   Ht 5\' 6"  (1.676 m)   Wt (!) 156.9 kg (345 lb 14.4 oz)   SpO2 100%   BMI 55.83 kg/m   Physical Exam  Constitutional: He is oriented to person, place, and time. He appears well-developed and well-nourished.  HENT:  Head: Normocephalic and atraumatic.  Eyes: EOM are normal. Pupils are equal, round, and reactive to light.  Neck: Normal range of motion. Neck supple. No JVD present.  Cardiovascular: Normal rate and regular rhythm. Exam reveals no gallop and no friction rub.  No murmur heard. Pulmonary/Chest: No respiratory distress. He has no wheezes.  Abdominal: He exhibits no distension and no mass. There is no tenderness. There is no rebound and no guarding.  Musculoskeletal: Normal range of motion. He exhibits edema.  Worse to the left lower extremity compared to the right.  Mildly warm compared to the other side.  No noted erythema.  Neurological: He is alert and oriented to person, place, and time.  Skin: No rash noted. No pallor.  Psychiatric: He has a normal mood and affect. His behavior is normal.  Nursing note and vitals reviewed.    ED Treatments / Results  Labs (all labs ordered are listed, but only abnormal results are displayed) Labs Reviewed  COMPREHENSIVE METABOLIC PANEL - Abnormal; Notable for the following components:      Result Value   ALT 13 (*)    All other components within normal limits  CBC WITH DIFFERENTIAL/PLATELET    EKG  EKG Interpretation None       Radiology No results found.  Procedures Procedures (including critical care time)  Medications Ordered in ED Medications - No data to display   Initial Impression / Assessment and Plan / ED Course  I have reviewed the triage vital signs and the  nursing notes.  Pertinent labs & imaging results that were available during my care of the patient were reviewed by me and considered in my medical decision making (see chart for details).     41 yo M with a chief complaint of left leg pain and swelling.  Clinically does not appear to be cellulitis.  We will repeat the DVT study.  Check labs.  With ongoing swelling likely needs to have his blood pressure medication change.  I discussed with him the need for outpatient follow-up.  US negative. Labs unremarkable.  D/c home.   8:48 AM:  I have discussed the diagnosis/risks/treatment options with the patient and family and believe the pt to be eligible for discharge home to follow-up with PCP. We also  discussed returning to the ED immediately if new or worsening sx occur. We discussed the sx which are most concerning (e.g., sudden worsening pain, fever, inability to tolerate by mouth) that necessitate immediate return. Medications administered to the patient during their visit and any new prescriptions provided to the patient are listed below.  Medications given during this visit Medications - No data to display   The patient appears reasonably screen and/or stabilized for discharge and I doubt any other medical condition or other [facility] requiring further screening, evaluation, or treatment in the ED at this time prior to discharge.    Final Clinical Impressions(s) / ED Diagnoses   Final diagnoses:  Edema    ED Discharge Orders        Ordered    hydrochlorothiazide (HYDRODIURIL) 25 MG tablet  Daily     06/18/17 0847       Melene PlanFloyd, Aima Mcwhirt, DO 06/18/17 [phone]

## 2017-06-18 NOTE — ED Notes (Signed)
Patient transported to Ultrasound 

## 2017-06-20 ENCOUNTER — Encounter (HOSPITAL_COMMUNITY): Payer: Self-pay | Admitting: Emergency Medicine

## 2017-06-20 ENCOUNTER — Ambulatory Visit (HOSPITAL_COMMUNITY)
Admission: EM | Admit: 2017-06-20 | Discharge: 2017-06-20 | Disposition: A | Discharge status: Home / Self Care | Payer: Self-pay | Attending: Family Medicine | Admitting: Family Medicine

## 2017-06-20 DIAGNOSIS — S29012A Strain of muscle and tendon of back wall of thorax, initial encounter: Secondary | ICD-10-CM

## 2017-06-20 MED ORDER — NAPROXEN 500 MG PO TABS: 500.0000 mg | ORAL_TABLET | Freq: Two times a day (BID) | ORAL | 0 refills | Status: DC

## 2017-06-20 NOTE — ED Triage Notes (Signed)
Pt here with back pain starting last night

## 2017-06-20 NOTE — Discharge Instructions (Signed)
Please take naproxen twice a day for muscle pain, take with food.  Light regular activity is recommended. Stretches to the area. Heat and/or ice to area.  Do not lift >20 lbs for the next 3 days.  If you need further restrictions please follow up with a primary care provider for further management. Please take your blood pressure medication.  Please establish with a primary care provider to continue to monitor your blood pressure.

## 2017-06-20 NOTE — ED Provider Notes (Signed)
[facility]    CSN: [id number] Arrival date & time: 06/20/17  1008     History   Chief Complaint Chief Complaint  Patient presents with  . Back Pain    HPI Timothy Roy is a 41 y.o. male.   Timothy Roy presents with complaints of mid back pain which developed yesterday. He states he sleeps on an air mattress and noticed some pain with awakening yesterday morning. Then at work he was moving a lot of items, which increased the pain. Last night he turned to his right and pain has been severe since. Rates it 10/10 with movements. 0/10 while sitting at rest. Denies previous back pain. Has not taken any medications for symptoms. Denies numbness tingling or weakness to arms or legs. Pain is worse with bending, walking or movements of arms. Has not established with a primary care provider yet. Did not take his blood pressure medication today which was recently prescribed from the ED. Denies urinary symptoms.    ROS per HPI.       Past Medical History:  Diagnosis Date  . Cellulitis   . Hypertension   . Obesity     Patient Active Problem List   Diagnosis Date Noted  . Morbid obesity (HCC) 11/03/2013  . Hyperglycemia 11/03/2013  . Heavy alcohol use 11/03/2013  . Cellulitis     Past Surgical History:  Procedure Laterality Date  . knee surgery         Home Medications    Prior to Admission medications   Medication Sig Start Date End Date Taking? Authorizing Provider  diclofenac sodium (VOLTAREN) 1 % GEL Apply 4 g topically 4 (four) times daily as needed (for knee pain). 07/31/16   Joy, Shawn C, PA-C  hydrochlorothiazide (HYDRODIURIL) 25 MG tablet Take 1 tablet (25 mg total) by mouth daily. 06/18/17   Melene PlanFloyd, Dan, DO  HYDROcodone-acetaminophen (NORCO/VICODIN) 5-325 MG tablet Take 1 tablet by mouth every 6 (six) hours as needed for moderate pain. 11/22/16   Gerhard MunchLockwood, Robert, MD  Multiple Vitamins-Minerals (MULTIVITAMIN PO) Take 1 tablet by mouth daily.     [provider]  naproxen (NAPROSYN) 500 MG tablet Take 1 tablet (500 mg total) by mouth 2 (two) times daily. 06/20/17   Georgetta HaberBurky, Wilmina Maxham B, NP  Phenyleph-Doxylamine-DM-APAP (ALKA SELTZER PLUS PO) Take 1 tablet by mouth daily as needed (cold symptoms).    [provider]  predniSONE (DELTASONE) 20 MG tablet Take 2 tablets (40 mg total) by mouth daily with breakfast. For the next four days 11/22/16   Gerhard MunchLockwood, Robert, MD  rizatriptan (MAXALT-MLT) 5 MG disintegrating tablet Take 5 mg by mouth as needed for migraine. May repeat in 2 hours if needed    [provider]    Family History History reviewed. No pertinent family history.  Social History Social History   Tobacco Use  . Smoking status: Current Every Day Smoker    Packs/day: 1.00    Years: 15.00    Pack years: 15.00    Types: Cigarettes  . Smokeless tobacco: Never Used  . Tobacco comment: HAS USED VAPOR CIG  Substance Use Topics  . Alcohol use: Yes    Comment: SOCIAL  . Drug use: No     Allergies   Patient has no known allergies.   Review of Systems Review of Systems   Physical Exam Triage Vital Signs ED Triage Vitals [06/20/17 1054]  Enc Vitals Group     BP (!) 175/109     Pulse Rate 68  Resp 18     Temp 98.2 F (36.8 C)     Temp Source Oral     SpO2 94 %     Weight      Height      Head Circumference      Peak Flow      Pain Score 10     Pain Loc      Pain Edu?      Excl. in GC?    No data found.  Updated Vital Signs BP (!) 175/109 (BP Location: Right Arm)   Pulse 68   Temp 98.2 F (36.8 C) (Oral)   Resp 18   SpO2 94%   Visual Acuity Right Eye Distance:   Left Eye Distance:   Bilateral Distance:    Right Eye Near:   Left Eye Near:    Bilateral Near:     Physical Exam  Constitutional: He is oriented to person, place, and time. He appears well-developed and well-nourished.  Cardiovascular: Normal rate and regular rhythm.  Pulmonary/Chest: Effort normal and  breath sounds normal.  Musculoskeletal:       Thoracic back: He exhibits tenderness and pain. He exhibits normal pulse.       Back:  Tenderness on palpation; reproducible with across body right arm adduction and external rotation; increased pain with pushing against resistance; sensation intact, strong radial pulses; strength equal to bilateral extremities  Neurological: He is alert and oriented to person, place, and time.  Skin: Skin is warm and dry.  Vitals reviewed.    UC Treatments / Results  Labs (all labs ordered are listed, but only abnormal results are displayed) Labs Reviewed - No data to display  EKG  EKG Interpretation None       Radiology No results found.  Procedures Procedures (including critical care time)  Medications Ordered in UC Medications - No data to display   Initial Impression / Assessment and Plan / UC Course  I have reviewed the triage vital signs and the nursing notes.  Pertinent labs & imaging results that were available during my care of the patient were reviewed by me and considered in my medical decision making (see chart for details).     Symptoms consistent with muscular strain. Naproxen twice a day recommended, light regular activity. Avoid lifting greater than 25lbs for the next three days. To establish with a primary care provider for long term management and recheck of bp.Patient verbalized understanding and agreeable to plan. Ambulatory out of clinic without difficulty.     Final Clinical Impressions(s) / UC Diagnoses   Final diagnoses:  Strain of mid-back, initial encounter    ED Discharge Orders        Ordered    naproxen (NAPROSYN) 500 MG tablet  2 times daily     06/20/17 1122       Controlled Substance Prescriptions [place] Controlled Substance Registry consulted? Not Applicable   Georgetta HaberBurky, Keyleigh Manninen B, NP 06/20/17 1131

## 2017-09-16 ENCOUNTER — Encounter (HOSPITAL_COMMUNITY): Payer: Self-pay | Admitting: *Deleted

## 2017-09-16 ENCOUNTER — Other Ambulatory Visit: Payer: Self-pay

## 2017-09-16 ENCOUNTER — Ambulatory Visit (HOSPITAL_COMMUNITY)
Admission: EM | Admit: 2017-09-16 | Discharge: 2017-09-16 | Disposition: A | Discharge status: Home / Self Care | Payer: BLUE CROSS/BLUE SHIELD | Attending: Physician Assistant | Admitting: Physician Assistant

## 2017-09-16 DIAGNOSIS — R42 Dizziness and giddiness: Secondary | ICD-10-CM

## 2017-09-16 DIAGNOSIS — H538 Other visual disturbances: Secondary | ICD-10-CM

## 2017-09-16 DIAGNOSIS — R030 Elevated blood-pressure reading, without diagnosis of hypertension: Secondary | ICD-10-CM

## 2017-09-16 DIAGNOSIS — I10 Essential (primary) hypertension: Secondary | ICD-10-CM

## 2017-09-16 DIAGNOSIS — F17200 Nicotine dependence, unspecified, uncomplicated: Secondary | ICD-10-CM

## 2017-09-16 HISTORY — DX: Localized edema: R60.0

## 2017-09-16 MED ORDER — ASPIRIN 81 MG PO TABS
81.0000 mg | ORAL_TABLET | Freq: Every day | ORAL | 3 refills | Status: DC
Start: 2017-09-16 — End: 2021-05-29

## 2017-09-16 MED ORDER — CLONIDINE HCL 0.1 MG PO TABS
0.1000 mg | ORAL_TABLET | Freq: Once | ORAL | Status: AC
  Administered 2017-09-16: 0.1 mg via ORAL

## 2017-09-16 MED ORDER — CLONIDINE HCL 0.1 MG PO TABS
ORAL_TABLET | ORAL | Status: AC
Start: 2017-09-16 — End: ?
  Filled 2017-09-16: qty 1

## 2017-09-16 MED ORDER — AMLODIPINE BESYLATE 10 MG PO TABS: 10.0000 mg | ORAL_TABLET | Freq: Every day | ORAL | 0 refills | Status: DC

## 2017-09-16 NOTE — Discharge Instructions (Signed)
[facility] at [facility], Enedina Pair, PA-C [phone] Make an appointment with me on Tuesday or Wednesday for a recheck on your blood pressure.

## 2017-09-16 NOTE — ED Provider Notes (Signed)
  MRN: [id number] DOB: February 21, 1976  Subjective:   Timothy Roy is a 42 y.o. male presenting for dizziness and blurred vision upon waking up this morning. Smokes 1/2ppd. Has ~1/5 of liquor per day, drank heavily last night. Has a history of HTN, has not taken his medications in months due to insurance changes and losing his PCP. Does not check blood pressure at home. Denies active dizziness and blurred vision but wanted to get checked. Denies fever, headaches, slurred speech, confusion, chest pain, shob, n/v, abdominal pain, hematuria, lower leg swelling.   Timothy Roy is not currently taking any medications and has No Known Allergies.  Timothy Roy  has a past medical history of Cellulitis, Extremity edema, Hypertension, and Obesity. Also  has a past surgical history that includes knee surgery.  Objective:   Vitals: BP (!) 190/113 (BP Location: Left Arm)   Pulse 68   Temp 98.3 F (36.8 C) (Oral)   Resp 16   SpO2 95%   BP Readings from Last 3 Encounters:  09/16/17 (!) 190/113  06/20/17 (!) 175/109  06/18/17 (!) 172/109    Physical Exam  Constitutional: He is oriented to person, place, and time. He appears well-developed and well-nourished.  HENT:  Mouth/Throat: Oropharynx is clear and moist.  Eyes: EOM are normal. Pupils are equal, round, and reactive to light. Right eye exhibits no discharge. Left eye exhibits no discharge. No scleral icterus.  Neck: Normal range of motion. Neck supple.  Cardiovascular: Normal rate, regular rhythm and intact distal pulses. Exam reveals no gallop and no friction rub.  No murmur heard. Pulmonary/Chest: No respiratory distress. He has no wheezes. He has no rales.  Musculoskeletal: He exhibits no edema.  Lymphadenopathy:    He has no cervical adenopathy.  Neurological: He is alert and oriented to person, place, and time. He displays normal reflexes. No cranial nerve deficit. Coordination normal.  Negative Romberg and Pronator. Speech intact.  Skin:  Skin is warm and dry.  Psychiatric: He has a normal mood and affect.   Assessment and Plan :   Dizziness  Blurred vision  Essential hypertension  Elevated blood pressure reading  Tobacco use disorder  Morbid obesity (HCC)  Physical exam findings reassuring. Patient given clonidine in clinic. Counseled on need for major lifestyle changes. Will have close follow up with me at PCP. Strict ER precautions given.   Wallis BambergMani, Dehaven Sine, PA-C 09/16/17 1753

## 2017-09-16 NOTE — ED Triage Notes (Signed)
Reports waking up this AM with bilat blurred vision and dizziness.  Blurred vision resolved at present, but has been intermittent throughout the day.  Describes dizziness as room spinning with position changes; denies lightheadedness.  Has not been taking HTN meds x "months" due to insurance issues.

## 2017-09-18 ENCOUNTER — Encounter: Payer: Self-pay | Admitting: Urgent Care

## 2017-09-18 ENCOUNTER — Ambulatory Visit: Payer: BLUE CROSS/BLUE SHIELD | Admitting: Urgent Care

## 2017-09-18 ENCOUNTER — Other Ambulatory Visit: Payer: Self-pay

## 2017-09-18 VITALS — BP 201/122 | HR 75 | Temp 97.8°F | Resp 18 | Ht 66.0 in | Wt 319.2 lb

## 2017-09-18 DIAGNOSIS — Z720 Tobacco use: Secondary | ICD-10-CM | POA: Diagnosis not present

## 2017-09-18 DIAGNOSIS — F1010 Alcohol abuse, uncomplicated: Secondary | ICD-10-CM | POA: Diagnosis not present

## 2017-09-18 DIAGNOSIS — Z23 Encounter for immunization: Secondary | ICD-10-CM | POA: Diagnosis not present

## 2017-09-18 DIAGNOSIS — I10 Essential (primary) hypertension: Secondary | ICD-10-CM | POA: Diagnosis not present

## 2017-09-18 DIAGNOSIS — I160 Hypertensive urgency: Secondary | ICD-10-CM

## 2017-09-18 DIAGNOSIS — R030 Elevated blood-pressure reading, without diagnosis of hypertension: Secondary | ICD-10-CM

## 2017-09-18 LAB — POCT URINALYSIS DIP (MANUAL ENTRY)
Bilirubin, UA: NEGATIVE
Blood, UA: NEGATIVE
Glucose, UA: NEGATIVE mg/dL
Ketones, POC UA: NEGATIVE mg/dL
LEUKOCYTES UA: NEGATIVE
Nitrite, UA: NEGATIVE
SPEC GRAV UA: 1.025 (ref 1.010–1.025)
Urobilinogen, UA: 0.2 E.U./dL
pH, UA: 6.5 (ref 5.0–8.0)

## 2017-09-18 MED ORDER — CHLORTHALIDONE 50 MG PO TABS: 50.0000 mg | ORAL_TABLET | Freq: Every day | ORAL | 1 refills | Status: DC

## 2017-09-18 MED ORDER — ATORVASTATIN CALCIUM 40 MG PO TABS: 40.0000 mg | ORAL_TABLET | Freq: Every day | ORAL | 1 refills | Status: DC

## 2017-09-18 MED ORDER — LOSARTAN POTASSIUM 100 MG PO TABS: 100.0000 mg | ORAL_TABLET | Freq: Every day | ORAL | 1 refills | Status: DC

## 2017-09-18 NOTE — Progress Notes (Signed)
MRN: [id number] DOB: [birth year]  Subjective:   Timothy Roy is a 42 y.o. male presenting for follow up on Hypertension. Was initially seen by me at [facility], started on amlodipine. At the time, Timothy Roy was being seen for dizziness. Denies dizziness, confusion, weakness, numbness or tingling, headache, blurred vision, chest pain, shortness of breath, heart racing, palpitations, nausea, vomiting, abdominal pain, hematuria, lower leg swelling. Denies smoking cigarettes since I saw him last. Timothy Roy is working on cutting back on his alcohol use by half. Will update tdap.   Timothy Roy has a current medication list which includes the following prescription(s): amlodipine, aspirin, diclofenac sodium, hydrochlorothiazide, hydrocodone-acetaminophen, multiple vitamins-minerals, naproxen, phenyleph-doxylamine-dm-apap, prednisone, and rizatriptan. Also has No Known Allergies.  Timothy Roy  has a past medical history of Cellulitis, Extremity edema, Hypertension, and Obesity. Also  has a past surgical history that includes knee surgery. His family history includes Hypertension in his father.   Objective:   Vitals: BP (!) 201/122   Pulse 75   Temp 97.8 F (36.6 C) (Oral)   Resp 18   Ht 5\' 6"  (1.676 m)   Wt (!) 319 lb 3.2 oz (144.8 kg)   SpO2 94%   BMI 51.52 kg/m   Physical Exam  Constitutional: Timothy Roy is oriented to person, place, and time. Timothy Roy appears well-developed and well-nourished.  HENT:  Right Ear: Tympanic membrane normal.  Left Ear: Tympanic membrane normal.  Mouth/Throat: Oropharynx is clear and moist.  Eyes: Right eye exhibits no discharge. Left eye exhibits no discharge. No scleral icterus.  Neck: Normal range of motion. Neck supple. No JVD present. No thyromegaly present.  Cardiovascular: Normal rate, regular rhythm and intact distal pulses. Exam reveals no gallop and no friction rub.  No murmur heard. Pulmonary/Chest: No respiratory distress. Timothy Roy has no wheezes. Timothy Roy has no  rales.  Musculoskeletal: Timothy Roy exhibits no edema.  Neurological: Timothy Roy is alert and oriented to person, place, and time. Timothy Roy displays normal reflexes. No cranial nerve deficit. Coordination normal.  Skin: Skin is warm and dry.   Results for orders placed or performed in visit on 09/18/17 (from the past 24 hour(s))  POCT urinalysis dipstick     Status: Abnormal   Collection Time: 09/18/17 12:03 PM  Result Value Ref Range   Color, UA yellow yellow   Clarity, UA clear clear   Glucose, UA negative negative mg/dL   Bilirubin, UA negative negative   Ketones, POC UA negative negative mg/dL   Spec Grav, UA 7.829 5.621 - 1.025   Blood, UA negative negative   pH, UA 6.5 5.0 - 8.0   Protein Ur, POC =30 (A) negative mg/dL   Urobilinogen, UA 0.2 0.2 or 1.0 E.U./dL   Nitrite, UA Negative Negative   Leukocytes, UA Negative Negative   Assessment and Plan :   Hypertensive urgency  Essential hypertension - Plan: Comprehensive metabolic panel, Lipid panel, POCT urinalysis dipstick, Microalbumin / creatinine urine ratio  Elevated blood pressure reading  Morbid obesity (HCC) - Plan: Lipid panel, Hemoglobin A1c  Alcohol abuse  Tobacco use  Counseled Timothy Roy on signs of ACS, stroke, kidney injury. Strict ER precautions reviewed. We will attempt to lower his blood pressure in a safe manner, discussed parameters for the next 72 hours. Dosing titration instructions given for losartan, chlorthalidone. Timothy Roy will continue efforts with alcohol use, advised Timothy Roy not to stop due to risk of seizure, withdrawal symptoms. Strict ER precautions reviewed for Timothy Roy with this as well. Labs pending. Will have Timothy Roy start on atorvastatin. Timothy Roy  is also to start low dose aspirin 81mg . A total of 40 was spent in the room with the Timothy Roy, greater than 50% of which was in counseling/coordination of care regarding above diagnoses including risks of ACS, stroke, alcohol withdrawal. Return-to-clinic precautions discussed, Timothy Roy  verbalized understanding. Otherwise, will follow up in 1 week.  Wallis BambergMario Deny Chevez, PA-C [facility] [phone] 09/18/2017  11:46 AM

## 2017-09-18 NOTE — Patient Instructions (Addendum)
Losartan - Start taking 1/2 tablet for the next 3 days. Then increase to 1 full tablet daily.  Chlorthalidone - Start taking 1/2 tablet in 5 days.   If you develop chest pain, shortness of breath, headaches, dizziness, weakness, confusion, you need to report to the ER immediately.   Regarding alcohol, I need you to cut back on your normal amount by 1/2.    Hypertension Hypertension is another name for high blood pressure. High blood pressure forces your heart to work harder to pump blood. This can cause problems over time. There are two numbers in a blood pressure reading. There is a top number (systolic) over a bottom number (diastolic). It is best to have a blood pressure below 120/80. Healthy choices can help lower your blood pressure. You may need medicine to help lower your blood pressure if:  Your blood pressure cannot be lowered with healthy choices.  Your blood pressure is higher than 130/80.  Follow these instructions at home: Eating and drinking  If directed, follow the DASH eating plan. This diet includes: ? Filling half of your plate at each meal with fruits and vegetables. ? Filling one quarter of your plate at each meal with whole grains. Whole grains include whole wheat pasta, brown rice, and whole grain bread. ? Eating or drinking low-fat dairy products, such as skim milk or low-fat yogurt. ? Filling one quarter of your plate at each meal with low-fat (lean) proteins. Low-fat proteins include fish, skinless chicken, eggs, beans, and tofu. ? Avoiding fatty meat, cured and processed meat, or chicken with skin. ? Avoiding premade or processed food.  Eat less than 1,500 mg of salt (sodium) a day.  Limit alcohol use to no more than 1 drink a day for nonpregnant women and 2 drinks a day for men. One drink equals 12 oz of beer, 5 oz of wine, or 1 oz of hard liquor. Lifestyle  Work with your doctor to stay at a healthy weight or to lose weight. Ask your doctor what the best  weight is for you.  Get at least 30 minutes of exercise that causes your heart to beat faster (aerobic exercise) most days of the week. This may include walking, swimming, or biking.  Get at least 30 minutes of exercise that strengthens your muscles (resistance exercise) at least 3 days a week. This may include lifting weights or pilates.  Do not use any products that contain nicotine or tobacco. This includes cigarettes and e-cigarettes. If you need help quitting, ask your doctor.  Check your blood pressure at home as told by your doctor.  Keep all follow-up visits as told by your doctor. This is important. Medicines  Take over-the-counter and prescription medicines only as told by your doctor. Follow directions carefully.  Do not skip doses of blood pressure medicine. The medicine does not work as well if you skip doses. Skipping doses also puts you at risk for problems.  Ask your doctor about side effects or reactions to medicines that you should watch for. Contact a doctor if:  You think you are having a reaction to the medicine you are taking.  You have headaches that keep coming back (recurring).  You feel dizzy.  You have swelling in your ankles.  You have trouble with your vision. Get help right away if:  You get a very bad headache.  You start to feel confused.  You feel weak or numb.  You feel faint.  You get very bad pain in  your: ? Chest. ? Belly (abdomen).  You throw up (vomit) more than once.  You have trouble breathing. Summary  Hypertension is another name for high blood pressure.  Making healthy choices can help lower blood pressure. If your blood pressure cannot be controlled with healthy choices, you may need to take medicine. This information is not intended to replace advice given to you by your health care provider. Make sure you discuss any questions you have with your health care provider. Document Released: 12/20/2007 Document Revised:  05/31/2016 Document Reviewed: 05/31/2016 Elsevier Interactive Patient Education  2018 [employer]     IF you received an x-ray today, you will receive an invoice from [facility]. Please contact [facility] at [phone] with questions or concerns regarding your invoice.   IF you received labwork today, you will receive an invoice from [facility]. Please contact [facility] at [phone] with questions or concerns regarding your invoice.   Our billing staff will not be able to assist you with questions regarding bills from these companies.  You will be contacted with the lab results as soon as they are available. The fastest way to get your results is to activate your My Chart account. Instructions are located on the last page of this paperwork. If you have not heard from us regarding the results in 2 weeks, please contact this office.

## 2017-09-19 LAB — COMPREHENSIVE METABOLIC PANEL
ALK PHOS: 101 IU/L (ref 39–117)
ALT: 19 IU/L (ref 0–44)
AST: 23 IU/L (ref 0–40)
Albumin/Globulin Ratio: 1.2 (ref 1.2–2.2)
Albumin: 4.5 g/dL (ref 3.5–5.5)
BUN/Creatinine Ratio: 12 (ref 9–20)
BUN: 9 mg/dL (ref 6–24)
Bilirubin Total: 0.5 mg/dL (ref 0.0–1.2)
CO2: 26 mmol/L (ref 20–29)
CREATININE: 0.75 mg/dL — AB (ref 0.76–1.27)
Calcium: 10.9 mg/dL — ABNORMAL HIGH (ref 8.7–10.2)
Chloride: 97 mmol/L (ref 96–106)
GFR calc Af Amer: 133 mL/min/{1.73_m2} (ref >59)
GFR calc non Af Amer: 115 mL/min/{1.73_m2} (ref >59)
GLUCOSE: 87 mg/dL (ref 65–99)
Globulin, Total: 3.8 g/dL (ref 1.5–4.5)
Potassium: 4.4 mmol/L (ref 3.5–5.2)
Sodium: 141 mmol/L (ref 134–144)
Total Protein: 8.3 g/dL (ref 6.0–8.5)

## 2017-09-19 LAB — HEMOGLOBIN A1C
Est. average glucose Bld gHb Est-mCnc: 126 mg/dL
Hgb A1c MFr Bld: 6 % — ABNORMAL HIGH (ref 4.8–5.6)

## 2017-09-19 LAB — MICROALBUMIN / CREATININE URINE RATIO
Creatinine, Urine: 120.4 mg/dL
MICROALBUM., U, RANDOM: 65.6 ug/mL
Microalb/Creat Ratio: 54.5 mg/g creat — ABNORMAL HIGH (ref 0.0–30.0)

## 2017-09-25 ENCOUNTER — Ambulatory Visit: Payer: BLUE CROSS/BLUE SHIELD | Admitting: Urgent Care

## 2017-09-25 ENCOUNTER — Encounter: Payer: Self-pay | Admitting: Urgent Care

## 2017-09-25 VITALS — BP 133/80 | HR 96 | Temp 98.2°F | Resp 17 | Ht 66.0 in | Wt 317.0 lb

## 2017-09-25 DIAGNOSIS — F1010 Alcohol abuse, uncomplicated: Secondary | ICD-10-CM | POA: Diagnosis not present

## 2017-09-25 DIAGNOSIS — I10 Essential (primary) hypertension: Secondary | ICD-10-CM

## 2017-09-25 DIAGNOSIS — R030 Elevated blood-pressure reading, without diagnosis of hypertension: Secondary | ICD-10-CM | POA: Diagnosis not present

## 2017-09-25 DIAGNOSIS — Z720 Tobacco use: Secondary | ICD-10-CM

## 2017-09-25 DIAGNOSIS — R7303 Prediabetes: Secondary | ICD-10-CM

## 2017-09-25 DIAGNOSIS — I160 Hypertensive urgency: Secondary | ICD-10-CM

## 2017-09-25 NOTE — Patient Instructions (Addendum)
Please make sure that you are eating less or limit your carbohydrates such as Lubeck rice, potatoes, Bendix breads, pasta, pizza, sodas, beer, sweet tea, fruit juices. Exercise can also help with your blood sugar. You can instead eat more salads, fruits, vegetables, whole grains, wheat, oats. Do not drink beer, pastries, sweets. Avoid ALL SALT in your foods.   Salads - kale, spinach, cabbage, spring mix; use seeds like pumpkin seeds or sunflower seeds; you can also use 1-2 hard boiled eggs. Fruits - avocadoes, berries (blueberries, raspberries, blackberries), apples, oranges, pomegranate, grapefruit Seeds - quinoa, chia seeds; you can also incorporate oatmeal Vegetables - aspargus, cauliflower, broccoli, green beans, brussel spouts, bell peppers; stay away from starchy vegetables like potatoes, carrots, peas  Brussel sprouts - Cut off stems. Place in a mixing bowl that has a lid. Pour in a 1/4-1/2 cup olive oil, spices, use a light amount of parmesan. Place on a baking sheet. Bake for 10 minutes at 400F. Take it out, eat the brussel chips. Place for another 5-10 minutes.   Mashed cauliflower - Boil a bunch of cauliflower in a pot of water. Blend in a food processor with 1-2 tablespoons of butter.  Spaghetti squash -  Cut the squash in half very carefully, clean out seeds from the middle. Place 1/2 face down in a microwave safe dish with at least 2 inches of water. Make 4-6 slits on outside of spaghetti squash and microwave for 10-12 minutes. Take out the spaghetti using a metal spoon. Repeat for the other half.   Vega protein is good protein powder, make sure you use ~6 ice cubes to give it smoothie consistency together with ~4-6 ounces of vanilla soy milk. Throw cinnamon into your shake, use peanut butter. You can also use the fruits that I listed above. Throw spinach or kale into the shake.       Preventing Type 2 Diabetes Mellitus Type 2 diabetes (type 2 diabetes mellitus) is a long-term  (chronic) disease that affects blood sugar (glucose) levels. Normally, a hormone called insulin allows glucose to enter cells in the body. The cells use glucose for energy. In type 2 diabetes, one or both of these problems may be present:  The body does not make enough insulin.  The body does not respond properly to insulin that it makes (insulin resistance).  Insulin resistance or lack of insulin causes excess glucose to build up in the blood instead of going into cells. As a result, high blood glucose (hyperglycemia) develops, which can cause many complications. Being overweight or obese and having an inactive (sedentary) lifestyle can increase your risk for diabetes. Type 2 diabetes can be delayed or prevented by making certain nutrition and lifestyle changes. What nutrition changes can be made?  Eat healthy meals and snacks regularly. Keep a healthy snack with you for when you get hungry between meals, such as fruit or a handful of nuts.  Eat lean meats and proteins that are low in saturated fats, such as chicken, fish, egg whites, and beans. Avoid processed meats.  Eat plenty of fruits and vegetables and plenty of grains that have not been processed (whole grains). It is recommended that you eat: ? 1?2 cups of fruit every day. ? 2?3 cups of vegetables every day. ? 6?8 oz of whole grains every day, such as oats, whole wheat, bulgur, brown rice, quinoa, and millet.  Eat low-fat dairy products, such as milk, yogurt, and cheese.  Eat foods that contain healthy fats, such as nuts,  avocado, olive oil, and canola oil.  Drink water throughout the day. Avoid drinks that contain added sugar, such as soda or sweet tea.  Follow instructions from your health care provider about specific eating or drinking restrictions.  Control how much food you eat at a time (portion size). ? Check food labels to find out the serving sizes of foods. ? Use a kitchen scale to weigh amounts of foods.  Saute or  steam food instead of frying it. Cook with water or broth instead of oils or butter.  Limit your intake of: ? Salt (sodium). Have no more than 1 tsp (2,400 mg) of sodium a day. If you have heart disease or high blood pressure, have less than ? tsp (1,500 mg) of sodium a day. ? Saturated fat. This is fat that is solid at room temperature, such as butter or fat on meat. What lifestyle changes can be made?  Activity  Do moderate-intensity physical activity for at least 30 minutes on at least 5 days of the week, or as much as told by your health care provider.  Ask your health care provider what activities are safe for you. A mix of physical activities may be best, such as walking, swimming, cycling, and strength training.  Try to add physical activity into your day. For example: ? Park in spots that are farther away than usual, so that you walk more. For example, park in a far corner of the parking lot when you go to the office or the grocery store. ? Take a walk during your lunch break. ? Use stairs instead of elevators or escalators. Weight Loss  Lose weight as directed. Your health care provider can determine how much weight loss is best for you and can help you lose weight safely.  If you are overweight or obese, you may be instructed to lose at least 5?7 % of your body weight. Alcohol and Tobacco   Limit alcohol intake to no more than 1 drink a day for nonpregnant women and 2 drinks a day for men. One drink equals 12 oz of beer, 5 oz of wine, or 1 oz of hard liquor.  Do not use any tobacco products, such as cigarettes, chewing tobacco, and e-cigarettes. If you need help quitting, ask your health care provider. Work With Your Health Care Provider  Have your blood glucose tested regularly, as told by your health care provider.  Discuss your risk factors and how you can reduce your risk for diabetes.  Get screening tests as told by your health care provider. You may have screening  tests regularly, especially if you have certain risk factors for type 2 diabetes.  Make an appointment with a diet and nutrition specialist (registered dietitian). A registered dietitian can help you make a healthy eating plan and can help you understand portion sizes and food labels. Why are these changes important?  It is possible to prevent or delay type 2 diabetes and related health problems by making lifestyle and nutrition changes.  It can be difficult to recognize signs of type 2 diabetes. The best way to avoid possible damage to your body is to take actions to prevent the disease before you develop symptoms. What can happen if changes are not made?  Your blood glucose levels may keep increasing. Having high blood glucose for a long time is dangerous. Too much glucose in your blood can damage your blood vessels, heart, kidneys, nerves, and eyes.  You may develop prediabetes or type 2  diabetes. Type 2 diabetes can lead to many chronic health problems and complications, such as: ? Heart disease. ? Stroke. ? Blindness. ? Kidney disease. ? Depression. ? Poor circulation in the feet and legs, which could lead to surgical removal (amputation) in severe cases. Where to find support:  Ask your health care provider to recommend a registered dietitian, diabetes educator, or weight loss program.  Look for local or online weight loss groups.  Join a gym, fitness club, or outdoor activity group, such as a walking club. Where to find more information: To learn more about diabetes and diabetes prevention, visit:  American Diabetes Association (ADA): www.diabetes.[employer]  National Institute of Diabetes and Digestive and Kidney Diseases: ToyArticles.cawww.niddk.nih.gov/health-information/diabetes  To learn more about healthy eating, visit:  The U.S. Department of Agriculture Architect(USDA), Choose My Plate: http://yates.biz/www.choosemyplate.gov/food-groups  Office of Disease Prevention and Health Promotion (ODPHP), Dietary  Guidelines: ListingMagazine.siwww.health.gov/dietaryguidelines  Summary  You can reduce your risk for type 2 diabetes by increasing your physical activity, eating healthy foods, and losing weight as directed.  Talk with your health care provider about your risk for type 2 diabetes. Ask about any blood tests or screening tests that you need to have. This information is not intended to replace advice given to you by your health care provider. Make sure you discuss any questions you have with your health care provider. Document Released: 10/25/2015 Document Revised: 12/09/2015 Document Reviewed: 08/24/2015 Elsevier Interactive Patient Education  2018 [employer]     IF you received an x-ray today, you will receive an invoice from [facility]. Please contact [facility] at [phone] with questions or concerns regarding your invoice.   IF you received labwork today, you will receive an invoice from [facility]. Please contact [facility] at [phone] with questions or concerns regarding your invoice.   Our billing staff will not be able to assist you with questions regarding bills from these companies.  You will be contacted with the lab results as soon as they are available. The fastest way to get your results is to activate your My Chart account. Instructions are located on the last page of this paperwork. If you have not heard from us regarding the results in 2 weeks, please contact this office.

## 2017-09-25 NOTE — Progress Notes (Signed)
   MRN: [id number] DOB: 1975-11-13  Subjective:   Timothy Roy is a 42 y.o. male presenting for follow up on Hypertension. Currently managed with amlodipine, losartan, chlorthalidone. Last OV, he was also started on atorvastatin. Patient is checking blood pressure at home, generally 150's systolic. He is making significant dietary modifications. Reports that he has felt a little more fatigued now that his blood pressure is lower. Denies dizziness, chronic headache, blurred vision, chest pain, shortness of breath, heart racing, palpitations, nausea, vomiting, abdominal pain, hematuria, lower leg swelling. He has cut back on his smoking significantly. He is also working on cutting back on his alcohol use.   Timothy Roy has a current medication list which includes the following prescription(s): amlodipine, aspirin, atorvastatin, chlorthalidone, losartan, and multiple vitamins-minerals. Also has No Known Allergies.  Timothy Roy  has a past medical history of Cellulitis, Extremity edema, Hypertension, and Obesity. Also  has a past surgical history that includes knee surgery.  Objective:   Vitals: BP 133/80   Pulse 96   Temp 98.2 F (36.8 C) (Oral)   Resp 17   Ht 5\' 6"  (1.676 m)   Wt (!) 317 lb (143.8 kg)   SpO2 98%   BMI 51.17 kg/m   BP Readings from Last 3 Encounters:  09/25/17 133/80  09/18/17 (!) 201/122  09/16/17 (!) 190/113   Wt Readings from Last 3 Encounters:  09/25/17 (!) 317 lb (143.8 kg)  09/18/17 (!) 319 lb 3.2 oz (144.8 kg)  06/17/17 (!) 345 lb 14.4 oz (156.9 kg)    Physical Exam  Constitutional: He is oriented to person, place, and time. He appears well-developed and well-nourished.  HENT:  Mouth/Throat: Oropharynx is clear and moist.  Eyes: No scleral icterus.  Cardiovascular: Normal rate, regular rhythm and intact distal pulses. Exam reveals no gallop and no friction rub.  No murmur heard. Pulmonary/Chest: No respiratory distress. He has no wheezes. He has no  rales.  Neurological: He is alert and oriented to person, place, and time.  Psychiatric: He has a normal mood and affect.   Assessment and Plan :   Hypertensive urgency  Essential hypertension - Plan: Potassium  Elevated blood pressure reading  Morbid obesity (HCC)  Alcohol abuse - Plan: Potassium  Tobacco use  Pre-diabetes  Dramatically improved. Will continue current regimen. Counseled on dietary modifications. Recheck K+ today. Follow up in 4 weeks or sooner as needed.  Wallis BambergMario Lee Kalt, PA-C Primary Care at [facility] [phone] 09/25/2017  11:51 AM

## 2017-09-26 LAB — POTASSIUM: Potassium: 5.5 mmol/L — ABNORMAL HIGH (ref 3.5–5.2)

## 2017-09-27 ENCOUNTER — Other Ambulatory Visit: Payer: Self-pay | Admitting: Urgent Care

## 2017-09-27 DIAGNOSIS — E875 Hyperkalemia: Secondary | ICD-10-CM

## 2017-09-27 NOTE — Progress Notes (Unsigned)
Please call patient and let him know that his potassium level was slightly elevated. He needs to rtc if he has chest pain, heart racing, shob, dizziness, confusion. Otherwise, he is okay to follow up next month. However, I would like to have a recheck on his potassium. I set up a labs only check of his potassium. Thank you, Pietro CassisDavina!

## 2017-09-27 NOTE — Progress Notes (Signed)
Patient states he will come in next week on his day off to have labs drawn.  Then he will schedule a one month follow up while he is here to see Timothy BambergMario Mani, PA.

## 2017-10-02 ENCOUNTER — Ambulatory Visit (INDEPENDENT_AMBULATORY_CARE_PROVIDER_SITE_OTHER): Payer: BLUE CROSS/BLUE SHIELD | Admitting: Urgent Care

## 2017-10-02 ENCOUNTER — Telehealth: Payer: Self-pay | Admitting: Family Medicine

## 2017-10-02 DIAGNOSIS — E875 Hyperkalemia: Secondary | ICD-10-CM

## 2017-10-02 DIAGNOSIS — I10 Essential (primary) hypertension: Secondary | ICD-10-CM

## 2017-10-02 LAB — POTASSIUM: Potassium: 4.9 mmol/L (ref 3.5–5.2)

## 2017-10-02 NOTE — Progress Notes (Signed)
Lab Only Visit 

## 2017-10-02 NOTE — Telephone Encounter (Signed)
Called pt to let him know his RX was ready. I also made him an appt for 4/13 with Dr. Clelia CroftShaw. Made pt aware that Dr. Clelia CroftShaw would not refill again without an OV.

## 2017-10-26 ENCOUNTER — Other Ambulatory Visit: Payer: Self-pay

## 2017-10-26 ENCOUNTER — Ambulatory Visit: Payer: BLUE CROSS/BLUE SHIELD | Admitting: Urgent Care

## 2017-10-26 ENCOUNTER — Encounter: Payer: Self-pay | Admitting: Urgent Care

## 2017-10-26 VITALS — BP 122/80 | HR 81 | Temp 98.0°F | Resp 16 | Ht 66.0 in | Wt 314.0 lb

## 2017-10-26 DIAGNOSIS — I10 Essential (primary) hypertension: Secondary | ICD-10-CM | POA: Diagnosis not present

## 2017-10-26 DIAGNOSIS — Z720 Tobacco use: Secondary | ICD-10-CM | POA: Diagnosis not present

## 2017-10-26 DIAGNOSIS — R7303 Prediabetes: Secondary | ICD-10-CM

## 2017-10-26 NOTE — Progress Notes (Signed)
    MRN: [id number] DOB: 1975-11-12  Subjective:   Timothy Roy is a 42 y.o. male presenting for follow up on Hypertension. Currently managed with amlodipine, chlorthalidone, losartan. Patient is checking blood pressure at home, generally 100-120 systolic. Avoids salt in diet. Denies dizziness, chronic headache, blurred vision, chest pain, shortness of breath, heart racing, palpitations, nausea, vomiting, abdominal pain, hematuria, lower leg swelling.  He is still cutting back on his smoking.  Timothy Roy has a current medication list which includes the following prescription(s): amlodipine, aspirin, atorvastatin, chlorthalidone, losartan, and multiple vitamins-minerals. Also has No Known Allergies.  Timothy Roy  has a past medical history of Cellulitis, Extremity edema, Hypertension, and Obesity. Also  has a past surgical history that includes knee surgery.  Objective:   Vitals: BP 122/80   Pulse 81   Temp 98 F (36.7 C) (Oral)   Resp 16   Ht 5\' 6"  (1.676 m)   Wt (!) 314 lb (142.4 kg)   SpO2 98%   BMI 50.68 kg/m   Wt Readings from Last 3 Encounters:  10/26/17 (!) 314 lb (142.4 kg)  09/25/17 (!) 317 lb (143.8 kg)  09/18/17 (!) 319 lb 3.2 oz (144.8 kg)    BP Readings from Last 3 Encounters:  10/26/17 122/80  09/25/17 133/80  09/18/17 (!) 201/122   Physical Exam  Constitutional: He is oriented to person, place, and time. He appears well-developed and well-nourished.  HENT:  Mouth/Throat: Oropharynx is clear and moist.  Eyes: No scleral icterus.  Cardiovascular: Normal rate, regular rhythm and intact distal pulses. Exam reveals no gallop and no friction rub.  No murmur heard. Pulmonary/Chest: No respiratory distress. He has no wheezes. He has no rales.  Neurological: He is alert and oriented to person, place, and time.  Psychiatric: He has a normal mood and affect.   Assessment and Plan :   Essential hypertension  Morbid obesity (HCC)  Pre-diabetes  Tobacco  use  Patient has done very well.  I encouraged him to continue the hard work that he is done for both his blood pressure and overall health status.  Encourage patient to continue to quit smoking.  Parameters given for hypotension secondary to triple medications for his blood pressure.  Gave him instructions for cutting back on amlodipine as needed depending on his blood pressure readings.  Follow-up in 6 months or sooner if necessary.  Wallis BambergMario Roosevelt Eimers, PA-C Primary Care at [facility] [phone] 10/26/2017  1:51 PM

## 2017-10-26 NOTE — Patient Instructions (Addendum)
Acting her blood pressure at home and if it remains in the low 100s for systolic (top number) or in the 60s or lower diastolic (bottome number), then start taking half of a tablet of amlodipine.  This means he will go from 10 mg to 5 mg by taking half tablets.  If you experience chest pain, dizziness, heart racing, palpitations then come back to our clinic and do not wait 6 months for follow-up.  Keep up the good work with your blood pressure and overall general health.    IF you received an x-ray today, you will receive an invoice from [facility]. Please contact [facility] at [phone] with questions or concerns regarding your invoice.   IF you received labwork today, you will receive an invoice from [facility]. Please contact [facility] at [phone] with questions or concerns regarding your invoice.   Our billing staff will not be able to assist you with questions regarding bills from these companies.  You will be contacted with the lab results as soon as they are available. The fastest way to get your results is to activate your My Chart account. Instructions are located on the last page of this paperwork. If you have not heard from us regarding the results in 2 weeks, please contact this office.

## 2018-05-06 ENCOUNTER — Other Ambulatory Visit: Payer: Self-pay | Admitting: Urgent Care

## 2018-05-07 ENCOUNTER — Ambulatory Visit: Payer: BLUE CROSS/BLUE SHIELD | Admitting: Urgent Care
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
## 2018-12-18 ENCOUNTER — Other Ambulatory Visit: Payer: Self-pay | Admitting: Family Medicine

## 2019-05-03 ENCOUNTER — Ambulatory Visit (HOSPITAL_COMMUNITY)
Admission: EM | Admit: 2019-05-03 | Discharge: 2019-05-03 | Disposition: A | Discharge status: Home / Self Care | Payer: BLUE CROSS/BLUE SHIELD | Attending: Family Medicine | Admitting: Family Medicine

## 2019-05-03 ENCOUNTER — Encounter (HOSPITAL_COMMUNITY): Payer: Self-pay | Admitting: Family Medicine

## 2019-05-03 ENCOUNTER — Other Ambulatory Visit: Payer: Self-pay

## 2019-05-03 DIAGNOSIS — I10 Essential (primary) hypertension: Secondary | ICD-10-CM

## 2019-05-03 LAB — COMPREHENSIVE METABOLIC PANEL
ALT: 20 U/L (ref 0–44)
AST: 22 U/L (ref 15–41)
Albumin: 3.9 g/dL (ref 3.5–5.0)
Alkaline Phosphatase: 81 U/L (ref 38–126)
Anion gap: 9 (ref 5–15)
BUN: 14 mg/dL (ref 6–20)
CO2: 27 mmol/L (ref 22–32)
Calcium: 10.2 mg/dL (ref 8.9–10.3)
Chloride: 103 mmol/L (ref 98–111)
Creatinine, Ser: 0.89 mg/dL (ref 0.61–1.24)
GFR calc Af Amer: >60 mL/min (ref >60)
GFR calc non Af Amer: >60 mL/min (ref >60)
Glucose, Bld: 92 mg/dL (ref 70–99)
Potassium: 5 mmol/L (ref 3.5–5.1)
Sodium: 139 mmol/L (ref 135–145)
Total Bilirubin: 0.6 mg/dL (ref 0.3–1.2)
Total Protein: 8.2 g/dL — ABNORMAL HIGH (ref 6.5–8.1)

## 2019-05-03 MED ORDER — LOSARTAN POTASSIUM 100 MG PO TABS: 100.0000 mg | ORAL_TABLET | Freq: Every day | ORAL | 3 refills | Status: DC

## 2019-05-03 MED ORDER — ATORVASTATIN CALCIUM 40 MG PO TABS: 40.0000 mg | ORAL_TABLET | Freq: Every day | ORAL | 3 refills | Status: DC

## 2019-05-03 MED ORDER — AMLODIPINE BESYLATE 10 MG PO TABS: 10.0000 mg | ORAL_TABLET | Freq: Every day | ORAL | 3 refills | Status: DC

## 2019-05-03 MED ORDER — CHLORTHALIDONE 25 MG PO TABS: 25.0000 mg | ORAL_TABLET | Freq: Every day | ORAL | 3 refills | Status: DC

## 2019-05-03 NOTE — ED Provider Notes (Signed)
[facility]    CSN: [id number] Arrival date & time: 05/03/19  1034      History   Chief Complaint Chief Complaint  Patient presents with  . Hypertension    HPI Timothy Roy is a 43 y.o. male.   [name] who is 43 years old presenting with a history of hypertension, and symptoms of dizziness and blurry vision.  He has had these symptoms before (see note from March 2019 below) Once again, patient has been drinking heavily and stopped his blood pressure medicine for the last 6 months.  Over the last several days he has been having blurry vision and lightheaded feelings.  He has had no chest pain, shortness of breath, slurred speech, nausea or vomiting.  He does have some left lower leg swelling which is mild.    Note from 09/2017: Timothy OlpChristopher Roy is a 43 y.o. male presenting for dizziness and blurred vision upon waking up this morning. Smokes 1/2ppd. Has ~1/5 of liquor per day, drank heavily last night. Has a history of HTN, has not taken his medications in months due to insurance changes and losing his PCP. Does not check blood pressure at home. Denies active dizziness and blurred vision but wanted to get checked. Denies fever, headaches, slurred speech, confusion, chest pain, shob, n/v, abdominal pain, hematuria, lower leg swelling.      Past Medical History:  Diagnosis Date  . Cellulitis   . Extremity edema   . Hypertension   . Obesity     Patient Active Problem List   Diagnosis Date Noted  . Morbid obesity (HCC) 11/03/2013  . Hyperglycemia 11/03/2013  . Heavy alcohol use 11/03/2013  . Cellulitis     Past Surgical History:  Procedure Laterality Date  . knee surgery         Home Medications    Prior to Admission medications   Medication Sig Start Date End Date Taking? Authorizing Provider  amLODipine (NORVASC) 10 MG tablet Take 1 tablet (10 mg total) by mouth daily. 05/03/19   Elvina SidleLauenstein, Chasyn Cinque, MD   aspirin 81 MG tablet Take 1 tablet (81 mg total) by mouth daily. 09/16/17   Wallis BambergMani, Mario, PA-C  atorvastatin (LIPITOR) 40 MG tablet Take 1 tablet (40 mg total) by mouth daily. 05/03/19   Elvina SidleLauenstein, Joanathan Affeldt, MD  chlorthalidone (HYGROTON) 25 MG tablet Take 1 tablet (25 mg total) by mouth daily. 05/03/19   Elvina SidleLauenstein, Jiovany Scheffel, MD  losartan (COZAAR) 100 MG tablet Take 1 tablet (100 mg total) by mouth daily. 05/03/19   Elvina SidleLauenstein, Mckynleigh Mussell, MD  Multiple Vitamins-Minerals (MULTIVITAMIN PO) Take 1 tablet by mouth daily.    [provider]    Family History Family History  Problem Relation Age of Onset  . Hypertension Father     Social History Social History   Tobacco Use  . Smoking status: Current Every Day Smoker    Packs/day: 1.00    Years: 15.00    Pack years: 15.00    Types: Cigarettes  . Smokeless tobacco: Never Used  Substance Use Topics  . Alcohol use: Yes    Comment: socially  . Drug use: No     Allergies   Patient has no known allergies.   Review of Systems Review of Systems  Constitutional: Negative.   Eyes: Positive for visual disturbance.  Respiratory: Negative.   Cardiovascular: Negative.   Gastrointestinal: Negative.   Neurological: Positive for dizziness and light-headedness. Negative for tremors, seizures, syncope, facial asymmetry,  speech difficulty, weakness, numbness and headaches.  All other systems reviewed and are negative.    Physical Exam Triage Vital Signs ED Triage Vitals  Enc Vitals Group     BP      Pulse      Resp      Temp      Temp src      SpO2      Weight      Height      Head Circumference      Peak Flow      Pain Score      Pain Loc      Pain Edu?      Excl. in [place]?    No data found.  Updated Vital Signs BP (!) 204/122 (BP Location: Right Arm)   Pulse 85   Temp 98 F (36.7 C) (Oral)   Resp 18   Wt (!) 145.2 kg   SpO2 99%   BMI 51.65 kg/m    Physical Exam Vitals signs and nursing note reviewed.  Constitutional:       General: He is not in acute distress.    Appearance: Normal appearance. He is obese. He is not ill-appearing.  HENT:     Head: Normocephalic.     Right Ear: External ear normal.     Left Ear: External ear normal.     Mouth/Throat:     Pharynx: Oropharynx is clear.  Eyes:     Conjunctiva/sclera: Conjunctivae normal.  Neck:     Musculoskeletal: Normal range of motion and neck supple.  Cardiovascular:     Rate and Rhythm: Normal rate and regular rhythm.     Heart sounds: Normal heart sounds.  Pulmonary:     Effort: Pulmonary effort is normal.     Breath sounds: Normal breath sounds.  Abdominal:     Palpations: Abdomen is soft.  Musculoskeletal: Normal range of motion.     Left lower leg: Edema present.  Skin:    General: Skin is warm and dry.  Neurological:     General: No focal deficit present.     Mental Status: He is alert and oriented to person, place, and time.  Psychiatric:        Mood and Affect: Mood normal.      UC Treatments / Results  Labs (all labs ordered are listed, but only abnormal results are displayed) Labs Reviewed  COMPREHENSIVE METABOLIC PANEL    EKG   Radiology No results found.  Procedures Procedures (including critical care time)  Medications Ordered in UC Medications - No data to display  Initial Impression / Assessment and Plan / UC Course  I have reviewed the triage vital signs and the nursing notes.  Pertinent labs & imaging results that were available during my care of the patient were reviewed by me and considered in my medical decision making (see chart for details).    Final Clinical Impressions(s) / UC Diagnoses   Final diagnoses:  Essential hypertension   Discharge Instructions   None    ED Prescriptions    Medication Sig Dispense Auth. Provider   amLODipine (NORVASC) 10 MG tablet Take 1 tablet (10 mg total) by mouth daily. 90 tablet Robyn Haber, MD   atorvastatin (LIPITOR) 40 MG tablet Take 1 tablet (40 mg  total) by mouth daily. 90 tablet Robyn Haber, MD   chlorthalidone (HYGROTON) 25 MG tablet Take 1 tablet (25 mg total) by mouth daily. 90 tablet Robyn Haber, MD   losartan (  COZAAR) 100 MG tablet Take 1 tablet (100 mg total) by mouth daily. 90 tablet Elvina Sidle, MD     I have reviewed the PDMP during this encounter.   Elvina Sidle, MD 05/03/19 [phone]

## 2019-05-03 NOTE — ED Triage Notes (Signed)
Pt states she has not took his B/P meds in over 6 months. Pt states he has some dizziness and blurred vision.

## 2019-10-08 ENCOUNTER — Ambulatory Visit (HOSPITAL_COMMUNITY)
Admission: EM | Admit: 2019-10-08 | Discharge: 2019-10-08 | Disposition: A | Discharge status: Home / Self Care | Payer: Self-pay | Attending: Family Medicine | Admitting: Family Medicine

## 2019-10-08 ENCOUNTER — Encounter (HOSPITAL_COMMUNITY): Payer: Self-pay

## 2019-10-08 ENCOUNTER — Other Ambulatory Visit: Payer: Self-pay

## 2019-10-08 DIAGNOSIS — S83411A Sprain of medial collateral ligament of right knee, initial encounter: Secondary | ICD-10-CM

## 2019-10-08 MED ORDER — PREDNISONE 50 MG PO TABS: ORAL_TABLET | ORAL | 0 refills | Status: DC

## 2019-10-08 NOTE — ED Triage Notes (Signed)
Pt presents with swelling and pain in right knee since Saturday.

## 2019-10-08 NOTE — ED Provider Notes (Signed)
[place]    CSN: [id number] Arrival date & time: 10/08/19  1520      History   Chief Complaint Chief Complaint  Patient presents with  . Knee Pain    HPI Timothy Roy is a 44 y.o. male.   Established [facility] 44 yo patient with right knee pain x 4 days.  Pt presents with swelling and pain in right knee since Saturday.  He says he twisted the knee and has had pain and swelling since.  Most of the pain is in the posterior aspect of the right knee.  Patient works in Theatre manager at [employer].  He knows he has high blood pressure, but is neglected to pick up his medicines.     Past Medical History:  Diagnosis Date  . Cellulitis   . Extremity edema   . Hypertension   . Obesity     Patient Active Problem List   Diagnosis Date Noted  . Morbid obesity ([place]) 11/03/2013  . Hyperglycemia 11/03/2013  . Heavy alcohol use 11/03/2013  . Cellulitis     Past Surgical History:  Procedure Laterality Date  . knee surgery         Home Medications    Prior to Admission medications   Medication Sig Start Date End Date Taking? Authorizing Provider  amLODipine (NORVASC) 10 MG tablet Take 1 tablet (10 mg total) by mouth daily. 05/03/19   Robyn Haber, MD  aspirin 81 MG tablet Take 1 tablet (81 mg total) by mouth daily. 09/16/17   Jaynee Eagles, PA-C  atorvastatin (LIPITOR) 40 MG tablet Take 1 tablet (40 mg total) by mouth daily. 05/03/19   Robyn Haber, MD  chlorthalidone (HYGROTON) 25 MG tablet Take 1 tablet (25 mg total) by mouth daily. 05/03/19   Robyn Haber, MD  losartan (COZAAR) 100 MG tablet Take 1 tablet (100 mg total) by mouth daily. 05/03/19   Robyn Haber, MD  Multiple Vitamins-Minerals (MULTIVITAMIN PO) Take 1 tablet by mouth daily.    [provider]  predniSONE (DELTASONE) 50 MG tablet One daily with food 10/08/19   Robyn Haber, MD    Family History Family History  Problem Relation Age of Onset  . Hypertension Father      Social History Social History   Tobacco Use  . Smoking status: Current Every Day Smoker    Packs/day: 1.00    Years: 15.00    Pack years: 15.00    Types: Cigarettes  . Smokeless tobacco: Never Used  Substance Use Topics  . Alcohol use: Yes    Comment: socially  . Drug use: No     Allergies   Patient has no known allergies.   Review of Systems Review of Systems  Musculoskeletal: Positive for gait problem and joint swelling.  All other systems reviewed and are negative.    Physical Exam Triage Vital Signs ED Triage Vitals  Enc Vitals Group     BP 10/08/19 1541 (!) 156/112     Pulse Rate 10/08/19 1541 84     Resp 10/08/19 1541 17     Temp 10/08/19 1541 98.7 F (37.1 C)     Temp Source 10/08/19 1541 Oral     SpO2 10/08/19 1541 96 %     Weight --      Height --      Head Circumference --      Peak Flow --      Pain Score 10/08/19 1543 6     Pain Loc --  Pain Edu? --      Excl. in GC? --    No data found.  Updated Vital Signs BP (!) 156/112 (BP Location: Right Arm) Comment: bo blood pressure medication in a month  Pulse 84   Temp 98.7 F (37.1 C) (Oral)   Resp 17   SpO2 96%    Physical Exam Vitals and nursing note reviewed.  Constitutional:      General: He is not in acute distress.    Appearance: Normal appearance. He is obese. He is not ill-appearing.  HENT:     Head: Normocephalic.  Cardiovascular:     Rate and Rhythm: Normal rate.  Pulmonary:     Effort: Pulmonary effort is normal.     Breath sounds: Normal breath sounds.  Musculoskeletal:        General: Swelling, tenderness and signs of injury present. No deformity. Normal range of motion.     Cervical back: Normal range of motion and neck supple.     Comments: Tender right patella and popliteal area Moderate effusion.  Skin:    General: Skin is warm and dry.  Neurological:     General: No focal deficit present.     Mental Status: He is alert.  Psychiatric:        Mood and  Affect: Mood normal.        Behavior: Behavior normal.        Thought Content: Thought content normal.        Judgment: Judgment normal.      UC Treatments / Results  Labs (all labs ordered are listed, but only abnormal results are displayed) Labs Reviewed - No data to display  EKG   Radiology No results found.  Procedures Procedures (including critical care time)  Medications Ordered in UC Medications - No data to display  Initial Impression / Assessment and Plan / UC Course  I have reviewed the triage vital signs and the nursing notes.  Pertinent labs & imaging results that were available during my care of the patient were reviewed by me and considered in my medical decision making (see chart for details).    Final Clinical Impressions(s) / UC Diagnoses   Final diagnoses:  Sprain of medial collateral ligament of right knee, initial encounter     Discharge Instructions     Get back on your blood pressure medication today.    ED Prescriptions    Medication Sig Dispense Auth. Provider   predniSONE (DELTASONE) 50 MG tablet One daily with food 5 tablet Elvina Sidle, MD     I have reviewed the PDMP during this encounter.   Elvina Sidle, MD 10/08/19 1601

## 2019-10-08 NOTE — Discharge Instructions (Addendum)
Get back on your blood pressure medication today.

## 2020-03-31 ENCOUNTER — Other Ambulatory Visit: Payer: Self-pay

## 2020-03-31 ENCOUNTER — Ambulatory Visit (HOSPITAL_COMMUNITY)
Admission: EM | Admit: 2020-03-31 | Discharge: 2020-03-31 | Disposition: A | Discharge status: Home / Self Care | Payer: 59 | Attending: Family Medicine | Admitting: Family Medicine

## 2020-03-31 ENCOUNTER — Encounter (HOSPITAL_COMMUNITY): Payer: Self-pay | Admitting: Emergency Medicine

## 2020-03-31 DIAGNOSIS — Z20822 Contact with and (suspected) exposure to covid-19: Secondary | ICD-10-CM | POA: Diagnosis present

## 2020-03-31 LAB — SARS CORONAVIRUS 2 (TAT 6-24 HRS): SARS Coronavirus 2: NEGATIVE

## 2020-03-31 NOTE — ED Triage Notes (Signed)
Pt states his sons teammate tested positive for covid.

## 2020-03-31 NOTE — Discharge Instructions (Signed)

## 2020-07-30 ENCOUNTER — Ambulatory Visit (HOSPITAL_COMMUNITY)
Admission: EM | Admit: 2020-07-30 | Discharge: 2020-07-30 | Disposition: A | Discharge status: Home / Self Care | Payer: HRSA Program | Attending: Emergency Medicine | Admitting: Emergency Medicine

## 2020-07-30 DIAGNOSIS — U071 COVID-19: Secondary | ICD-10-CM | POA: Insufficient documentation

## 2020-07-30 DIAGNOSIS — I10 Essential (primary) hypertension: Secondary | ICD-10-CM | POA: Insufficient documentation

## 2020-07-30 DIAGNOSIS — Z20822 Contact with and (suspected) exposure to covid-19: Secondary | ICD-10-CM | POA: Diagnosis present

## 2020-07-30 LAB — COMPREHENSIVE METABOLIC PANEL
ALT: 22 U/L (ref 0–44)
AST: 23 U/L (ref 15–41)
Albumin: 3.7 g/dL (ref 3.5–5.0)
Alkaline Phosphatase: 68 U/L (ref 38–126)
Anion gap: 9 (ref 5–15)
BUN: 10 mg/dL (ref 6–20)
CO2: 27 mmol/L (ref 22–32)
Calcium: 10 mg/dL (ref 8.9–10.3)
Chloride: 100 mmol/L (ref 98–111)
Creatinine, Ser: 0.81 mg/dL (ref 0.61–1.24)
GFR, Estimated: >60 mL/min (ref >60)
Glucose, Bld: 90 mg/dL (ref 70–99)
Potassium: 4 mmol/L (ref 3.5–5.1)
Sodium: 136 mmol/L (ref 135–145)
Total Bilirubin: 1.2 mg/dL (ref 0.3–1.2)
Total Protein: 7.1 g/dL (ref 6.5–8.1)

## 2020-07-30 LAB — SARS CORONAVIRUS 2 (TAT 6-24 HRS): SARS Coronavirus 2: POSITIVE — AB

## 2020-07-30 MED ORDER — ATORVASTATIN CALCIUM 40 MG PO TABS: 40.0000 mg | ORAL_TABLET | Freq: Every day | ORAL | 0 refills | Status: DC

## 2020-07-30 MED ORDER — CHLORTHALIDONE 25 MG PO TABS: 25.0000 mg | ORAL_TABLET | Freq: Every day | ORAL | 0 refills | Status: DC

## 2020-07-30 MED ORDER — AMLODIPINE BESYLATE 10 MG PO TABS: 10.0000 mg | ORAL_TABLET | Freq: Every day | ORAL | 0 refills | Status: DC

## 2020-07-30 MED ORDER — LOSARTAN POTASSIUM 100 MG PO TABS: 100.0000 mg | ORAL_TABLET | Freq: Every day | ORAL | 0 refills | Status: DC

## 2020-07-30 NOTE — ED Triage Notes (Signed)
Pt presents with no sxs. Pt states he was exposed to someone that is COVID positive.

## 2020-07-30 NOTE — Discharge Instructions (Addendum)
Please refill your blood pressure medications and restart these.  Self isolate until covid results are back.  We will notify you by phone if it is positive. Your negative results will be sent through your MyChart.    If it is positive you need to isolate from others for a total of 5 days. If no fever for 24 hours without medications, and symptoms improving you may end isolation on day 6, but wear a mask if around any others for an additional 5 days.

## 2020-07-30 NOTE — ED Notes (Signed)
Pt BP reported to Provider. Pt will be seen.

## 2020-07-30 NOTE — ED Provider Notes (Signed)
[facility]    CSN: [id number] Arrival date & time: 07/30/20  1046      History   Chief Complaint Chief Complaint  Patient presents with  . Covid test  . Hypertension    HPI Timothy Roy is a 45 y.o. male.   Timothy Roy presents with requests for covid testing as he has family and his S/O who just tested positive for covid-19. No specific URI or GI symptoms. On check in by nursing was found to be hypertensive at 202/103, therefore he was checked in for provider evaluation. Denies any new peripheral edema, headache, vision changes, chest pain or shortness of breath. History of hypertension, and has been seen with similar blood pressure in the past. He has not filled or taken any blood pressure medication in approximately 1 year. Does not follow with a PCP. Had his medications refilled here last in October of 2020.    ROS per HPI, negative if not otherwise mentioned.      Past Medical History:  Diagnosis Date  . Cellulitis   . Extremity edema   . Hypertension   . Obesity     Patient Active Problem List   Diagnosis Date Noted  . Morbid obesity ([facility]) 11/03/2013  . Hyperglycemia 11/03/2013  . Heavy alcohol use 11/03/2013  . Cellulitis     Past Surgical History:  Procedure Laterality Date  . knee surgery         Home Medications    Prior to Admission medications   Medication Sig Start Date End Date Taking? Authorizing Provider  amLODipine (NORVASC) 10 MG tablet Take 1 tablet (10 mg total) by mouth daily. 07/30/20   Georgetta Haber, NP  aspirin 81 MG tablet Take 1 tablet (81 mg total) by mouth daily. 09/16/17   Wallis Bamberg, PA-C  atorvastatin (LIPITOR) 40 MG tablet Take 1 tablet (40 mg total) by mouth daily. 07/30/20   Georgetta Haber, NP  chlorthalidone (HYGROTON) 25 MG tablet Take 1 tablet (25 mg total) by mouth daily. 07/30/20   Georgetta Haber, NP  losartan (COZAAR) 100 MG tablet Take 1 tablet (100 mg total) by mouth daily. 07/30/20    Georgetta Haber, NP  Multiple Vitamins-Minerals (MULTIVITAMIN PO) Take 1 tablet by mouth daily.    [provider]  predniSONE (DELTASONE) 50 MG tablet One daily with food 10/08/19   Elvina Sidle, MD    Family History Family History  Problem Relation Age of Onset  . Hypertension Father     Social History Social History   Tobacco Use  . Smoking status: Current Every Day Smoker    Packs/day: 1.00    Years: 15.00    Pack years: 15.00    Types: Cigarettes  . Smokeless tobacco: Never Used  Vaping Use  . Vaping Use: Some days  Substance Use Topics  . Alcohol use: Yes    Comment: socially  . Drug use: No     Allergies   Patient has no known allergies.   Review of Systems Review of Systems   Physical Exam Triage Vital Signs ED Triage Vitals  Enc Vitals Group     BP 07/30/20 1218 (!) 202/103     Pulse Rate 07/30/20 1218 68     Resp 07/30/20 1218 16     Temp 07/30/20 1218 97.9 F (36.6 C)     Temp Source 07/30/20 1218 Oral     SpO2 07/30/20 1218 100 %     Weight --  Height --      Head Circumference --      Peak Flow --      Pain Score 07/30/20 1223 0     Pain Loc --      Pain Edu? --      Excl. in GC? --    No data found.  Updated Vital Signs BP (!) 183/103 (BP Location: Right Arm)   Pulse 73   Temp 98.6 F (37 C) (Oral)   Resp 20   SpO2 100%   Visual Acuity Right Eye Distance:   Left Eye Distance:   Bilateral Distance:    Right Eye Near:   Left Eye Near:    Bilateral Near:     Physical Exam Constitutional:      Appearance: He is well-developed.  Cardiovascular:     Rate and Rhythm: Normal rate.  Pulmonary:     Effort: Pulmonary effort is normal.  Skin:    General: Skin is warm and dry.  Neurological:     Mental Status: He is alert and oriented to person, place, and time.      UC Treatments / Results  Labs (all labs ordered are listed, but only abnormal results are displayed) Labs Reviewed  SARS CORONAVIRUS 2 (TAT  6-24 HRS)  COMPREHENSIVE METABOLIC PANEL    EKG   Radiology No results found.  Procedures Procedures (including critical care time)  Medications Ordered in UC Medications - No data to display  Initial Impression / Assessment and Plan / UC Course  I have reviewed the triage vital signs and the nursing notes.  Pertinent labs & imaging results that were available during my care of the patient were reviewed by me and considered in my medical decision making (see chart for details).     No hypertensive urgency symptoms here today. Medications refilled, patient states he will be able to go and refill these to take. cmp for baseline obtained. Emphasized follow up with pcp for recheck and management. Patient verbalized understanding and agreeable to plan.   Final Clinical Impressions(s) / UC Diagnoses   Final diagnoses:  Encounter for screening laboratory testing for COVID-19 virus in asymptomatic patient  Hypertension, unspecified type     Discharge Instructions     Please refill your blood pressure medications and restart these.  Self isolate until covid results are back.  We will notify you by phone if it is positive. Your negative results will be sent through your MyChart.    If it is positive you need to isolate from others for a total of 5 days. If no fever for 24 hours without medications, and symptoms improving you may end isolation on day 6, but wear a mask if around any others for an additional 5 days.      ED Prescriptions    Medication Sig Dispense Auth. Provider   amLODipine (NORVASC) 10 MG tablet Take 1 tablet (10 mg total) by mouth daily. 90 tablet Linus Mako B, NP   atorvastatin (LIPITOR) 40 MG tablet Take 1 tablet (40 mg total) by mouth daily. 90 tablet Linus Mako B, NP   chlorthalidone (HYGROTON) 25 MG tablet Take 1 tablet (25 mg total) by mouth daily. 90 tablet Linus Mako B, NP   losartan (COZAAR) 100 MG tablet Take 1 tablet (100 mg total) by  mouth daily. 90 tablet Georgetta Haber, NP     PDMP not reviewed this encounter.   Georgetta Haber, NP 07/30/20 1328

## 2021-05-29 ENCOUNTER — Encounter (HOSPITAL_COMMUNITY): Payer: Self-pay | Admitting: Emergency Medicine

## 2021-05-29 ENCOUNTER — Telehealth (HOSPITAL_COMMUNITY): Payer: Self-pay | Admitting: Family Medicine

## 2021-05-29 ENCOUNTER — Ambulatory Visit (HOSPITAL_COMMUNITY)
Admission: EM | Admit: 2021-05-29 | Discharge: 2021-05-29 | Disposition: A | Discharge status: Home / Self Care | Payer: Self-pay | Attending: Family Medicine | Admitting: Family Medicine

## 2021-05-29 ENCOUNTER — Other Ambulatory Visit: Payer: Self-pay

## 2021-05-29 DIAGNOSIS — R600 Localized edema: Secondary | ICD-10-CM

## 2021-05-29 DIAGNOSIS — M25511 Pain in right shoulder: Secondary | ICD-10-CM

## 2021-05-29 DIAGNOSIS — I10 Essential (primary) hypertension: Secondary | ICD-10-CM

## 2021-05-29 LAB — COMPREHENSIVE METABOLIC PANEL
ALT: 21 U/L (ref 0–44)
AST: 27 U/L (ref 15–41)
Albumin: 3.8 g/dL (ref 3.5–5.0)
Alkaline Phosphatase: 88 U/L (ref 38–126)
Anion gap: 6 (ref 5–15)
BUN: 8 mg/dL (ref 6–20)
CO2: 29 mmol/L (ref 22–32)
Calcium: 10.4 mg/dL — ABNORMAL HIGH (ref 8.9–10.3)
Chloride: 104 mmol/L (ref 98–111)
Creatinine, Ser: 0.83 mg/dL (ref 0.61–1.24)
GFR, Estimated: >60 mL/min (ref >60)
Glucose, Bld: 100 mg/dL — ABNORMAL HIGH (ref 70–99)
Potassium: 4.4 mmol/L (ref 3.5–5.1)
Sodium: 139 mmol/L (ref 135–145)
Total Bilirubin: 0.7 mg/dL (ref 0.3–1.2)
Total Protein: 7.3 g/dL (ref 6.5–8.1)

## 2021-05-29 MED ORDER — CARVEDILOL 3.125 MG PO TABS: 3.1250 mg | ORAL_TABLET | Freq: Two times a day (BID) | ORAL | 0 refills | Status: DC

## 2021-05-29 MED ORDER — INDOMETHACIN 50 MG PO CAPS: 50.0000 mg | ORAL_CAPSULE | Freq: Two times a day (BID) | ORAL | 0 refills | Status: AC

## 2021-05-29 MED ORDER — LOSARTAN POTASSIUM 100 MG PO TABS: 100.0000 mg | ORAL_TABLET | Freq: Every day | ORAL | 0 refills | Status: AC

## 2021-05-29 NOTE — ED Triage Notes (Signed)
Pt presents with left knee pain and right shoulder pain xs 2 weeks.

## 2021-05-29 NOTE — ED Provider Notes (Signed)
[facility]    CSN: [id number] Arrival date & time: 05/29/21  1023      History   Chief Complaint Chief Complaint  Patient presents with   Shoulder Pain    right   Knee Pain    left    HPI Timothy Roy is a 45 y.o. male.   HPI Patient with a medical history significant for morbid obesity, hypertensive urgency, chronic alcoholism, and hyperglycemia presents today with bilateral leg pain due to swelling, and right shoulder pain. Patient endorsing being without his blood pressure medications for nearly a year.  He is not followed by primary care provider.  On arrival today patient's blood pressure is 183/105. He denies any shortness of breath, headache, or chest pain. Right shoulder pain x 2 weeks. No known injury. Patient works a physically intense occupation and lifts heavy objects often. He has not taken any medication for shoulder pain. He has full ROM with extension, internal rotation, however limited ROM with external rotation. No prior issue with shoulder. Denies weakness with gripping or in LUE.  Past Medical History:  Diagnosis Date   Cellulitis    Extremity edema    Hypertension    Obesity     Patient Active Problem List   Diagnosis Date Noted   Morbid obesity ([facility]) 11/03/2013   Hyperglycemia 11/03/2013   Heavy alcohol use 11/03/2013   Cellulitis     Past Surgical History:  Procedure Laterality Date   knee surgery         Home Medications    Prior to Admission medications   Medication Sig Start Date End Date Taking? Authorizing Provider  carvedilol (COREG) 3.125 MG tablet Take 1 tablet (3.125 mg total) by mouth 2 (two) times daily with a meal. 05/29/21  Yes Bing Neighbors, FNP  indomethacin (INDOCIN) 50 MG capsule Take 1 capsule (50 mg total) by mouth 2 (two) times daily with a meal for 7 days. 05/29/21 06/05/21 Yes Bing Neighbors, FNP  atorvastatin (LIPITOR) 40 MG tablet Take 1 tablet (40 mg total) by mouth daily. 07/30/20   Georgetta Haber, NP  chlorthalidone (HYGROTON) 25 MG tablet Take 1 tablet (25 mg total) by mouth daily. 07/30/20   Georgetta Haber, NP  losartan (COZAAR) 100 MG tablet Take 1 tablet (100 mg total) by mouth daily. 05/29/21   Bing Neighbors, FNP  Multiple Vitamins-Minerals (MULTIVITAMIN PO) Take 1 tablet by mouth daily.    [provider]  predniSONE (DELTASONE) 50 MG tablet One daily with food 10/08/19   Elvina Sidle, MD    Family History Family History  Problem Relation Age of Onset   Hypertension Father     Social History Social History   Tobacco Use   Smoking status: Every Day    Packs/day: 1.00    Years: 15.00    Pack years: 15.00    Types: Cigarettes   Smokeless tobacco: Never  Vaping Use   Vaping Use: Some days  Substance Use Topics   Alcohol use: Yes    Comment: socially   Drug use: No     Allergies   Patient has no known allergies.   Review of Systems Review of Systems Pertinent negatives listed in HPI  Physical Exam Triage Vital Signs ED Triage Vitals  Enc Vitals Group     BP 05/29/21 1213 (!) 183/105     Pulse Rate 05/29/21 1213 84     Resp 05/29/21 1213 17     Temp 05/29/21  1213 98.9 F (37.2 C)     Temp Source 05/29/21 1213 Oral     SpO2 05/29/21 1213 95 %     Weight --      Height --      Head Circumference --      Peak Flow --      Pain Score 05/29/21 1211 7     Pain Loc --      Pain Edu? --      Excl. in GC? --    No data found.  Updated Vital Signs BP (!) 183/105 (BP Location: Right Arm)   Pulse 84   Temp 98.9 F (37.2 C) (Oral)   Resp 17   SpO2 95%   Visual Acuity Right Eye Distance:   Left Eye Distance:   Bilateral Distance:    Right Eye Near:   Left Eye Near:    Bilateral Near:     Physical Exam Constitutional:      Appearance: He is obese.  HENT:     Head: Normocephalic.  Eyes:     Extraocular Movements: Extraocular movements intact.     Pupils: Pupils are equal, round, and reactive to light.  Neck:      Vascular: No carotid bruit.  Cardiovascular:     Rate and Rhythm: Normal rate and regular rhythm.     Heart sounds: Murmur heard.  Pulmonary:     Effort: Pulmonary effort is normal.     Breath sounds: Normal breath sounds.  Musculoskeletal:     Left shoulder: Bony tenderness present. No swelling. Decreased range of motion.     Cervical back: Normal range of motion.     Right lower leg: 2+ Pitting Edema present.     Left lower leg: 3+ Pitting Edema present.  Skin:    General: Skin is warm.     Capillary Refill: Capillary refill takes less than 2 seconds.  Neurological:     General: No focal deficit present.     Mental Status: He is alert and oriented to person, place, and time.  Psychiatric:        Mood and Affect: Mood normal.        Behavior: Behavior normal.        Thought Content: Thought content normal.        Judgment: Judgment normal.     UC Treatments / Results  Labs (all labs ordered are listed, but only abnormal results are displayed) Labs Reviewed  COMPREHENSIVE METABOLIC PANEL    EKG   Radiology No results found.  Procedures Procedures (including critical care time)  Medications Ordered in UC Medications - No data to display  Initial Impression / Assessment and Plan / UC Course  I have reviewed the triage vital signs and the nursing notes.  Pertinent labs & imaging results that were available during my care of the patient were reviewed by me and considered in my medical decision making (see chart for details).    Acute right shoulder pain, exam finding consistent with bursitis. Grip strength intact-trial indomethacin BID x 7 days. Accelerated HTN with BLE, audible murmur noted on exam. Discussed with patient increased risk for heart failure with ongoing poor control of blood pressure. Refilled losartan and started coreg. Did not resume amlodipine as patient already has severe edema. Pending labs, will restart diuretic if kidney function and  electrolytes are stable. Final Clinical Impressions(s) / UC Diagnoses   Final diagnoses:  Acute pain of right shoulder  Accelerated hypertension  Bilateral  lower extremity edema     Discharge Instructions      I placed referrals for both primary care and hypertension heart clinic.  Both offices will contact you to get you scheduled for an appointment. I have refilled your blood pressure medications.  I will send over medication to help with the fluid in your legs however need to review the lab work prior to doing so. If you experience sudden onset of shortness of breath, chest pain or leg swelling that does not improve with medication immediately to the emergency department we will review the results of possible heart dysfunction.     ED Prescriptions     Medication Sig Dispense Auth. Provider   losartan (COZAAR) 100 MG tablet Take 1 tablet (100 mg total) by mouth daily. 90 tablet Bing Neighbors, FNP   carvedilol (COREG) 3.125 MG tablet Take 1 tablet (3.125 mg total) by mouth 2 (two) times daily with a meal. 60 tablet Bing Neighbors, FNP   indomethacin (INDOCIN) 50 MG capsule Take 1 capsule (50 mg total) by mouth 2 (two) times daily with a meal for 7 days. 14 capsule Bing Neighbors, FNP      PDMP not reviewed this encounter.   Bing Neighbors, FNP 05/29/21 1334

## 2021-05-29 NOTE — Telephone Encounter (Signed)
Labs are WNL. Sent prescription for Chlorthalidone

## 2021-05-29 NOTE — Discharge Instructions (Addendum)
I placed referrals for both primary care and hypertension heart clinic.  Both offices will contact you to get you scheduled for an appointment. I have refilled your blood pressure medications.  I will send over medication to help with the fluid in your legs however need to review the lab work prior to doing so. If you experience sudden onset of shortness of breath, chest pain or leg swelling that does not improve with medication immediately to the emergency department we will review the results of possible heart dysfunction.

## 2021-05-30 ENCOUNTER — Telehealth (HOSPITAL_COMMUNITY): Payer: Self-pay

## 2021-05-30 NOTE — Telephone Encounter (Signed)
Pt not answered phone.

## 2021-07-11 ENCOUNTER — Other Ambulatory Visit: Payer: Self-pay | Admitting: Family Medicine

## 2021-08-04 ENCOUNTER — Ambulatory Visit (HOSPITAL_BASED_OUTPATIENT_CLINIC_OR_DEPARTMENT_OTHER): Payer: Self-pay | Admitting: Cardiovascular Disease

## 2021-09-15 ENCOUNTER — Ambulatory Visit (HOSPITAL_BASED_OUTPATIENT_CLINIC_OR_DEPARTMENT_OTHER): Payer: Self-pay | Admitting: Cardiovascular Disease

## 2021-09-15 ENCOUNTER — Encounter (HOSPITAL_BASED_OUTPATIENT_CLINIC_OR_DEPARTMENT_OTHER): Payer: Self-pay

## 2022-09-08 ENCOUNTER — Ambulatory Visit (HOSPITAL_COMMUNITY)
Admission: EM | Admit: 2022-09-08 | Discharge: 2022-09-08 | Disposition: A | Discharge status: Home / Self Care | Payer: Self-pay | Attending: Emergency Medicine | Admitting: Emergency Medicine

## 2022-09-08 ENCOUNTER — Encounter (HOSPITAL_COMMUNITY): Payer: Self-pay

## 2022-09-08 DIAGNOSIS — R058 Other specified cough: Secondary | ICD-10-CM | POA: Insufficient documentation

## 2022-09-08 DIAGNOSIS — F17210 Nicotine dependence, cigarettes, uncomplicated: Secondary | ICD-10-CM | POA: Insufficient documentation

## 2022-09-08 DIAGNOSIS — I10 Essential (primary) hypertension: Secondary | ICD-10-CM | POA: Insufficient documentation

## 2022-09-08 DIAGNOSIS — J069 Acute upper respiratory infection, unspecified: Secondary | ICD-10-CM | POA: Insufficient documentation

## 2022-09-08 DIAGNOSIS — R42 Dizziness and giddiness: Secondary | ICD-10-CM | POA: Insufficient documentation

## 2022-09-08 DIAGNOSIS — Z1152 Encounter for screening for COVID-19: Secondary | ICD-10-CM | POA: Insufficient documentation

## 2022-09-08 DIAGNOSIS — R600 Localized edema: Secondary | ICD-10-CM | POA: Insufficient documentation

## 2022-09-08 DIAGNOSIS — H538 Other visual disturbances: Secondary | ICD-10-CM | POA: Insufficient documentation

## 2022-09-08 HISTORY — DX: Pure hypercholesterolemia, unspecified: E78.00

## 2022-09-08 LAB — CBC WITH DIFFERENTIAL/PLATELET
Abs Immature Granulocytes: 0.01 10*3/uL (ref 0.00–0.07)
Basophils Absolute: 0 10*3/uL (ref 0.0–0.1)
Basophils Relative: 0 %
Eosinophils Absolute: 0.2 10*3/uL (ref 0.0–0.5)
Eosinophils Relative: 5 %
HCT: 44.8 % (ref 39.0–52.0)
Hemoglobin: 15.4 g/dL (ref 13.0–17.0)
Immature Granulocytes: 0 %
Lymphocytes Relative: 47 %
Lymphs Abs: 2.1 10*3/uL (ref 0.7–4.0)
MCH: 30.5 pg (ref 26.0–34.0)
MCHC: 34.4 g/dL (ref 30.0–36.0)
MCV: 88.7 fL (ref 80.0–100.0)
Monocytes Absolute: 0.5 10*3/uL (ref 0.1–1.0)
Monocytes Relative: 10 %
Neutro Abs: 1.7 10*3/uL (ref 1.7–7.7)
Neutrophils Relative %: 38 %
Platelets: 220 10*3/uL (ref 150–400)
RBC: 5.05 MIL/uL (ref 4.22–5.81)
RDW: 13.5 % (ref 11.5–15.5)
WBC: 4.6 10*3/uL (ref 4.0–10.5)
nRBC: 0 % (ref 0.0–0.2)

## 2022-09-08 LAB — COMPREHENSIVE METABOLIC PANEL
ALT: 11 U/L (ref 0–44)
AST: 22 U/L (ref 15–41)
Albumin: 3.7 g/dL (ref 3.5–5.0)
Alkaline Phosphatase: 78 U/L (ref 38–126)
Anion gap: 10 (ref 5–15)
BUN: 18 mg/dL (ref 6–20)
CO2: 25 mmol/L (ref 22–32)
Calcium: 10.2 mg/dL (ref 8.9–10.3)
Chloride: 103 mmol/L (ref 98–111)
Creatinine, Ser: 1.05 mg/dL (ref 0.61–1.24)
GFR, Estimated: >60 mL/min (ref >60)
Glucose, Bld: 74 mg/dL (ref 70–99)
Potassium: 4.8 mmol/L (ref 3.5–5.1)
Sodium: 138 mmol/L (ref 135–145)
Total Bilirubin: 1 mg/dL (ref 0.3–1.2)
Total Protein: 7.4 g/dL (ref 6.5–8.1)

## 2022-09-08 LAB — POCT URINALYSIS DIPSTICK, ED / UC
Bilirubin Urine: NEGATIVE
Glucose, UA: NEGATIVE mg/dL
Hgb urine dipstick: NEGATIVE
Leukocytes,Ua: NEGATIVE
Nitrite: NEGATIVE
Protein, ur: 30 mg/dL — AB
Specific Gravity, Urine: 1.025 (ref 1.005–1.030)
Urobilinogen, UA: 2 mg/dL — ABNORMAL HIGH (ref 0.0–1.0)
pH: 6 (ref 5.0–8.0)

## 2022-09-08 MED ORDER — BENZONATATE 100 MG PO CAPS: 100.0000 mg | ORAL_CAPSULE | Freq: Three times a day (TID) | ORAL | 0 refills | Status: DC

## 2022-09-08 MED ORDER — AMLODIPINE BESYLATE 5 MG PO TABS: 5.0000 mg | ORAL_TABLET | Freq: Every day | ORAL | 0 refills | Status: DC

## 2022-09-08 NOTE — ED Provider Notes (Signed)
[place]    CSN: [id number] Arrival date & time: 09/08/22  1301      History   Chief Complaint Chief Complaint  Patient presents with   Cough   Headache   Blurred Vision   Dizziness    HPI Timothy Roy is a 47 y.o. male.   Reports he feels as if his chest is congested, has a productive cough w/ [place] phlegm x2 days. Unsure if he has sore throat. Smokes 1 pack per week. ETOH use is typically drinks a fifth of brandy a day on the weekend and similar intake throughout the week, but less due to his job. Has not had any ETOH today. Denies CP or SOB. Reports body aches.   He reports headaches and intermittent blurred vision, he is hypertensive with a history of the same, he never refilled his triple therapy and does not have current PCP. Denies current blurred vision.   Reports LE edema, this ongoing and not too bad today. Denies LE pain.   BP 167/104, HR 80s, O2 97% RA, T 98  The history is provided by the patient.  Cough Associated symptoms: headaches and sore throat   Associated symptoms: no chest pain, no chills, no ear pain, no fever, no rash and no shortness of breath   Headache Associated symptoms: cough, dizziness and sore throat   Associated symptoms: no abdominal pain, no back pain, no ear pain, no eye pain, no fever and no vomiting   Dizziness Associated symptoms: headaches   Associated symptoms: no chest pain, no palpitations, no shortness of breath and no vomiting     Past Medical History:  Diagnosis Date   Cellulitis    Extremity edema    High cholesterol    Hypertension    Obesity     Patient Active Problem List   Diagnosis Date Noted   Morbid obesity ([place]) 11/03/2013   Hyperglycemia 11/03/2013   Heavy alcohol use 11/03/2013   Cellulitis     Past Surgical History:  Procedure Laterality Date   knee surgery         Home Medications    Prior to Admission medications   Medication Sig Start Date End Date Taking? Authorizing  Provider  amLODipine (NORVASC) 5 MG tablet Take 1 tablet (5 mg total) by mouth daily. 09/08/22  Yes Louretta Shorten, [place] N, FNP  benzonatate (TESSALON) 100 MG capsule Take 1 capsule (100 mg total) by mouth every 8 (eight) hours. 09/08/22  Yes Louretta Shorten, [place], FNP  chlorthalidone (HYGROTON) 25 MG tablet Take 1 tablet (25 mg total) by mouth daily. 07/30/20   Zigmund Gottron, NP  losartan (COZAAR) 100 MG tablet Take 1 tablet (100 mg total) by mouth daily. 05/29/21   Scot Jun, NP  Multiple Vitamins-Minerals (MULTIVITAMIN PO) Take 1 tablet by mouth daily.    [provider]  predniSONE (DELTASONE) 50 MG tablet One daily with food 10/08/19   Robyn Haber, MD    Family History Family History  Problem Relation Age of Onset   Stroke Mother    Hypertension Father     Social History Social History   Tobacco Use   Smoking status: Every Day    Packs/day: 0.25    Years: 15.00    Total pack years: 3.75    Types: Cigarettes   Smokeless tobacco: Never  Vaping Use   Vaping Use: Former  Substance Use Topics   Alcohol use: Yes    Comment: socially   Drug use: No  Allergies   Patient has no known allergies.   Review of Systems Review of Systems  Constitutional:  Negative for chills and fever.  HENT:  Positive for sore throat. Negative for ear pain.   Eyes:  Negative for pain and visual disturbance.  Respiratory:  Positive for cough. Negative for shortness of breath.   Cardiovascular:  Positive for leg swelling. Negative for chest pain and palpitations.  Gastrointestinal:  Negative for abdominal pain and vomiting.  Genitourinary:  Negative for dysuria.  Musculoskeletal:  Negative for arthralgias and back pain.  Skin:  Negative for color change and rash.  Neurological:  Positive for dizziness and headaches.  All other systems reviewed and are negative.    Physical Exam Triage Vital Signs ED Triage Vitals [09/08/22 1441]  Enc Vitals Group     BP      Pulse       Resp      Temp      Temp src      SpO2      Weight      Height      Head Circumference      Peak Flow      Pain Score 5     Pain Loc      Pain Edu?      Excl. in [place]?    No data found.  Updated Vital Signs There were no vitals taken for this visit.  Visual Acuity Right Eye Distance:   Left Eye Distance:   Bilateral Distance:    Right Eye Near:   Left Eye Near:    Bilateral Near:     Physical Exam Vitals and nursing note reviewed.  Constitutional:      General: He is not in acute distress.    Appearance: Normal appearance. He is well-developed.     Comments: Pleasant 47 year old male who appears stated age.  HENT:     Head: Normocephalic and atraumatic.     Right Ear: External ear normal.     Left Ear: External ear normal.     Nose: Rhinorrhea present.     Mouth/Throat:     Mouth: Mucous membranes are moist.     Pharynx: Posterior oropharyngeal erythema present.  Eyes:     Conjunctiva/sclera: Conjunctivae normal.     Pupils: Pupils are equal, round, and reactive to light.  Cardiovascular:     Rate and Rhythm: Normal rate and regular rhythm.     Heart sounds: Normal heart sounds, S1 normal and S2 normal. No murmur heard. Pulmonary:     Effort: Pulmonary effort is normal. No respiratory distress.     Breath sounds: Normal breath sounds.     Comments: Lungs vesicular posteriorly. Musculoskeletal:        General: Normal range of motion.     Cervical back: Neck supple.     Right lower leg: 1+ Edema present.     Left lower leg: 1+ Edema present.  Lymphadenopathy:     Head:     Right side of head: Submandibular adenopathy present.     Left side of head: Submandibular adenopathy present.     Cervical: Cervical adenopathy present.  Skin:    General: Skin is warm and dry.     Capillary Refill: Capillary refill takes less than 2 seconds.  Neurological:     Mental Status: He is alert and oriented to person, place, and time.     GCS: GCS eye subscore is 4. GCS  verbal subscore is 5.  GCS motor subscore is 6.  Psychiatric:        Mood and Affect: Mood normal.        Behavior: Behavior is cooperative.      UC Treatments / Results  Labs (all labs ordered are listed, but only abnormal results are displayed) Labs Reviewed  POCT URINALYSIS DIPSTICK, ED / UC - Abnormal; Notable for the following components:      Result Value   Ketones, ur TRACE (*)    Protein, ur 30 (*)    Urobilinogen, UA 2.0 (*)    All other components within normal limits  SARS CORONAVIRUS 2 (TAT 6-24 HRS)  COMPREHENSIVE METABOLIC PANEL  CBC WITH DIFFERENTIAL/PLATELET    EKG   Radiology No results found.  Procedures Procedures (including critical care time)  Medications Ordered in UC Medications - No data to display  Initial Impression / Assessment and Plan / UC Course  I have reviewed the triage vital signs and the nursing notes.  Pertinent labs & imaging results that were available during my care of the patient were reviewed by me and considered in my medical decision making (see chart for details).  Vitals and triage note reviewed, patient is hemodynamically stable.  Cough, sore throat, lymphadenopathy consistent with viral upper respiratory infection.  COVID-19 swab obtained in clinic.  Advised symptomatic management.  Urine dipstick positive for ketones, protein and urobilinogen. I will start on 5 mg amlodipine today for uncontrolled HTN, as we do not have to adjust dose for kidney or liver function.  Heavily advised follow-up with a primary care provider for further management and monitoring.  Advise cutting back on alcohol, patient is in denial over this problem.  Emergency precautions and plan of care discussed, patient verbalized understanding.     Final Clinical Impressions(s) / UC Diagnoses   Final diagnoses:  Essential hypertension  Viral upper respiratory tract infection with cough     Discharge Instructions      Your symptoms are consistent  with a viral upper respiratory infection.  We have swabbed for COVID-19 and will call if results are positive.  For this you can take over-the-counter treatments like Tylenol and ibuprofen as needed for aches and pains.  You can sleep with a humidifier and drink tea with honey for your sore throat.  You can take [employer] as needed for cough.  Blood pressure was significantly elevated today in clinic.  I have started you on amlodipine 5 mg.  Please take this once daily.  It is imperative that you follow-up with a primary care to assess response to this medication and for ongoing management.  Please measure your blood pressure when you wake up, and then before you go to bed and bring this daily log to your appointment with your primary care.  Please seek immediate care if you develop changes in vision, worst headache of your life, blood pressure that is over 200/100.      ED Prescriptions     Medication Sig Dispense Auth. Provider   amLODipine (NORVASC) 5 MG tablet Take 1 tablet (5 mg total) by mouth daily. 30 tablet Louretta Shorten, [place] N, [place]   benzonatate (TESSALON) 100 MG capsule Take 1 capsule (100 mg total) by mouth every 8 (eight) hours. 21 capsule Dariyon Urquilla, [place] N, [place]      I have reviewed the PDMP during this encounter.   Firas Guardado, [place] N, [place] 09/08/22 [phone]

## 2022-09-08 NOTE — Discharge Instructions (Addendum)
Your symptoms are consistent with a viral upper respiratory infection.  We have swabbed for COVID-19 and will call if results are positive.  For this you can take over-the-counter treatments like Tylenol and ibuprofen as needed for aches and pains.  You can sleep with a humidifier and drink tea with honey for your sore throat.  You can take [employer] as needed for cough.  Blood pressure was significantly elevated today in clinic.  I have started you on amlodipine 5 mg.  Please take this once daily.  It is imperative that you follow-up with a primary care to assess response to this medication and for ongoing management.  Please measure your blood pressure when you wake up, and then before you go to bed and bring this daily log to your appointment with your primary care.  Please seek immediate care if you develop changes in vision, worst headache of your life, blood pressure that is over 200/100.

## 2022-09-08 NOTE — ED Triage Notes (Signed)
Patient c/o a productive cough with [place] sputum x 2 days.  Patient states he took one dose of Dayquil 2 days ago.   Patient also c/o headache, blurred vision ane dizziness. Patient states he has not taken his BP meds in more than a year.

## 2022-09-09 LAB — SARS CORONAVIRUS 2 (TAT 6-24 HRS): SARS Coronavirus 2: NEGATIVE

## 2022-11-23 ENCOUNTER — Encounter (HOSPITAL_COMMUNITY): Payer: Self-pay | Admitting: *Deleted

## 2022-11-23 ENCOUNTER — Ambulatory Visit (HOSPITAL_COMMUNITY)
Admission: EM | Admit: 2022-11-23 | Discharge: 2022-11-23 | Disposition: A | Discharge status: Home / Self Care | Payer: Self-pay | Attending: Family Medicine | Admitting: Family Medicine

## 2022-11-23 ENCOUNTER — Ambulatory Visit (INDEPENDENT_AMBULATORY_CARE_PROVIDER_SITE_OTHER): Payer: Self-pay

## 2022-11-23 DIAGNOSIS — R0781 Pleurodynia: Secondary | ICD-10-CM

## 2022-11-23 DIAGNOSIS — R109 Unspecified abdominal pain: Secondary | ICD-10-CM

## 2022-11-23 DIAGNOSIS — J189 Pneumonia, unspecified organism: Secondary | ICD-10-CM

## 2022-11-23 MED ORDER — DOXYCYCLINE HYCLATE 100 MG PO CAPS: 100.0000 mg | ORAL_CAPSULE | Freq: Two times a day (BID) | ORAL | 0 refills | Status: DC

## 2022-11-23 MED ORDER — KETOROLAC TROMETHAMINE 30 MG/ML IJ SOLN
30.0000 mg | Freq: Once | INTRAMUSCULAR | Status: AC
  Administered 2022-11-23: 30 mg via INTRAMUSCULAR

## 2022-11-23 MED ORDER — CYCLOBENZAPRINE HCL 10 MG PO TABS: 10.0000 mg | ORAL_TABLET | Freq: Two times a day (BID) | ORAL | 0 refills | Status: DC | PRN

## 2022-11-23 MED ORDER — KETOROLAC TROMETHAMINE 30 MG/ML IJ SOLN
INTRAMUSCULAR | Status: AC
  Filled 2022-11-23: qty 1

## 2022-11-23 NOTE — ED Triage Notes (Signed)
Pt states he started having left side flank pain that radiates around to his abdomin and chest. He did take IBU yesterday and it did help the pain some. 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   Pt states he has been prescribed blood pressure meds but he never went to pick them up.

## 2022-11-23 NOTE — ED Provider Notes (Signed)
[facility]    CSN: [id number] Arrival date & time: 11/23/22  1401      History   Chief Complaint Chief Complaint  Patient presents with   Flank Pain   Chest Pain    HPI Timothy Roy is a 47 y.o. male.   He is here for left back/flank pain that radiates into the chest.  This started 2 days ago.  Pain is worsening. 10/10 in pain at this time.  He took motrin with some help, but not much.  No n/v.  Painful to take a deep breath, coughing.  Painful to move.  He is unable to move the left arm due to pain.  No urinary symptoms.  He has not eaten today b/c he hurts to bad.  Never happened before. No diarrhea or constipation.  He does drink daily,  usually two drinks/day hard ETOH.  He was able to drink soda here without  worsening of pain.        Past Medical History:  Diagnosis Date   Cellulitis    Extremity edema    High cholesterol    Hypertension    Obesity     Patient Active Problem List   Diagnosis Date Noted   Morbid obesity ([facility]) 11/03/2013   Hyperglycemia 11/03/2013   Heavy alcohol use 11/03/2013   Cellulitis     Past Surgical History:  Procedure Laterality Date   knee surgery         Home Medications    Prior to Admission medications   Medication Sig Start Date End Date Taking? Authorizing Provider  amLODipine (NORVASC) 5 MG tablet Take 1 tablet (5 mg total) by mouth daily. 09/08/22   Garrison, [place] N, FNP  benzonatate (TESSALON) 100 MG capsule Take 1 capsule (100 mg total) by mouth every 8 (eight) hours. 09/08/22   Garrison, [place] N, FNP  chlorthalidone (HYGROTON) 25 MG tablet Take 1 tablet (25 mg total) by mouth daily. 07/30/20   Georgetta Haber, NP  losartan (COZAAR) 100 MG tablet Take 1 tablet (100 mg total) by mouth daily. 05/29/21   Bing Neighbors, NP  Multiple Vitamins-Minerals (MULTIVITAMIN PO) Take 1 tablet by mouth daily.    [provider]  predniSONE (DELTASONE) 50 MG tablet One daily with food  10/08/19   Elvina Sidle, MD    Family History Family History  Problem Relation Age of Onset   Stroke Mother    Hypertension Father     Social History Social History   Tobacco Use   Smoking status: Every Day    Packs/day: 0.25    Years: 15.00    Additional pack years: 0.00    Total pack years: 3.75    Types: Cigarettes   Smokeless tobacco: Never  Vaping Use   Vaping Use: Former  Substance Use Topics   Alcohol use: Yes    Comment: socially   Drug use: No     Allergies   Patient has no known allergies.   Review of Systems Review of Systems  Constitutional: Negative.   HENT: Negative.    Cardiovascular:  Positive for chest pain.  Gastrointestinal: Negative.   Genitourinary:  Positive for flank pain.  Musculoskeletal:  Positive for back pain.  Skin: Negative.   Psychiatric/Behavioral: Negative.       Physical Exam Triage Vital Signs ED Triage Vitals  Enc Vitals Group     BP 11/23/22 1425 (!) 176/107     Pulse Rate 11/23/22 1425 87  Resp 11/23/22 1425 20     Temp 11/23/22 1425 98.7 F (37.1 C)     Temp Source 11/23/22 1425 Oral     SpO2 11/23/22 1425 92 %     Weight --      Height --      Head Circumference --      Peak Flow --      Pain Score 11/23/22 1424 10     Pain Loc --      Pain Edu? --      Excl. in [place]? --    No data found.  Updated Vital Signs BP (!) 176/107 (BP Location: Right Arm)   Pulse 87   Temp 98.7 F (37.1 C) (Oral)   Resp 20   SpO2 92%   Visual Acuity Right Eye Distance:   Left Eye Distance:   Bilateral Distance:    Right Eye Near:   Left Eye Near:    Bilateral Near:     Physical Exam Constitutional:      General: He is not in acute distress.    Appearance: He is well-developed. He is not ill-appearing or diaphoretic.  Cardiovascular:     Rate and Rhythm: Normal rate and regular rhythm.     Heart sounds: Normal heart sounds.  Pulmonary:     Effort: Pulmonary effort is normal.     Breath sounds: Normal  breath sounds.  Chest:     Chest wall: No tenderness.  Abdominal:     General: Bowel sounds are normal.     Palpations: Abdomen is soft.     Tenderness: There is no abdominal tenderness.  Musculoskeletal:     Cervical back: Normal range of motion.     Comments: TTP to the left lateral ribs;  pain with movement of the left arm, and taking a deep breath in.   Skin:    General: Skin is warm.  Neurological:     General: No focal deficit present.     Mental Status: He is alert.      UC Treatments / Results  Labs (all labs ordered are listed, but only abnormal results are displayed) Labs Reviewed - No data to display  EKG   Radiology DG Chest 2 View  Result Date: 11/23/2022 CLINICAL DATA:  Left flank and rib pain. EXAM: CHEST - 2 VIEW COMPARISON:  11/05/2007 FINDINGS: Streaky opacity noted left lung base. Right lung clear. The cardiopericardial silhouette is within normal limits for size. Probable small left pleural effusion. IMPRESSION: Streaky opacity at the left base may be atelectasis although pneumonia is not excluded. Small left pleural effusion. Electronically Signed   By: Kennith Center M.D.   On: 11/23/2022 15:09    Procedures Procedures (including critical care time)  Medications Ordered in UC Medications  ketorolac (TORADOL) 30 MG/ML injection 30 mg (30 mg Intramuscular Given 11/23/22 1444)    Initial Impression / Assessment and Plan / UC Course  I have reviewed the triage vital signs and the nursing notes.  Pertinent labs & imaging results that were available during my care of the patient were reviewed by me and considered in my medical decision making (see chart for details).   Final Clinical Impressions(s) / UC Diagnoses   Final diagnoses:  Flank pain  Rib pain on left side  Pneumonia of left lower lobe due to infectious organism     Discharge Instructions      You were seen today for left sided pain.  I think this is  muscular in nature, although your  chest xray questioned a pneumonia.  I have sent out an antibiotic, as well as a muscle relaxer to help with pain. The muscle relaxer may make you tired/sleepy so take when you are home and not driving.  You may take tylenol for pain otherwise.  I would also trial heat or ice to help. Follow up if you are not improving or if you are worsening.      ED Prescriptions     Medication Sig Dispense Auth. Provider   cyclobenzaprine (FLEXERIL) 10 MG tablet Take 1 tablet (10 mg total) by mouth 2 (two) times daily as needed for muscle spasms. 20 tablet Marian Meneely, MD   doxycycline (VIBRAMYCIN) 100 MG capsule Take 1 capsule (100 mg total) by mouth 2 (two) times daily. 20 capsule Jannifer Franklin, MD      PDMP not reviewed this encounter.   Jannifer Franklin, MD 11/23/22 [phone]

## 2022-11-23 NOTE — Discharge Instructions (Addendum)
You were seen today for left sided pain.  I think this is muscular in nature, although your chest xray questioned a pneumonia.  I have sent out an antibiotic, as well as a muscle relaxer to help with pain. The muscle relaxer may make you tired/sleepy so take when you are home and not driving.  You may take tylenol for pain otherwise.  I would also trial heat or ice to help. Follow up if you are not improving or if you are worsening.

## 2022-12-04 NOTE — Progress Notes (Signed)
Erroneous encounter-disregard

## 2022-12-12 ENCOUNTER — Encounter: Payer: Self-pay | Admitting: Family

## 2022-12-12 DIAGNOSIS — Z7689 Persons encountering health services in other specified circumstances: Secondary | ICD-10-CM

## 2024-03-30 ENCOUNTER — Ambulatory Visit (HOSPITAL_COMMUNITY): Payer: Self-pay

## 2024-03-30 ENCOUNTER — Ambulatory Visit (HOSPITAL_COMMUNITY)
Admission: EM | Admit: 2024-03-30 | Discharge: 2024-03-30 | Disposition: A | Discharge status: Home / Self Care | Payer: Self-pay | Attending: Emergency Medicine | Admitting: Emergency Medicine

## 2024-03-30 ENCOUNTER — Other Ambulatory Visit: Payer: Self-pay

## 2024-03-30 ENCOUNTER — Telehealth (HOSPITAL_COMMUNITY): Payer: Self-pay

## 2024-03-30 ENCOUNTER — Encounter (HOSPITAL_COMMUNITY): Payer: Self-pay | Admitting: *Deleted

## 2024-03-30 DIAGNOSIS — I10 Essential (primary) hypertension: Secondary | ICD-10-CM | POA: Insufficient documentation

## 2024-03-30 DIAGNOSIS — R051 Acute cough: Secondary | ICD-10-CM

## 2024-03-30 DIAGNOSIS — R531 Weakness: Secondary | ICD-10-CM | POA: Insufficient documentation

## 2024-03-30 LAB — COMPREHENSIVE METABOLIC PANEL WITH GFR
ALT: 14 U/L (ref 0–44)
AST: 26 U/L (ref 15–41)
Albumin: 3.7 g/dL (ref 3.5–5.0)
Alkaline Phosphatase: 79 U/L (ref 38–126)
Anion gap: 14 (ref 5–15)
BUN: 15 mg/dL (ref 6–20)
CO2: 24 mmol/L (ref 22–32)
Calcium: 9.8 mg/dL (ref 8.9–10.3)
Chloride: 104 mmol/L (ref 98–111)
Creatinine, Ser: 0.99 mg/dL (ref 0.61–1.24)
GFR, Estimated: >60 mL/min (ref >60)
Glucose, Bld: 50 mg/dL — ABNORMAL LOW (ref 70–99)
Potassium: 4.1 mmol/L (ref 3.5–5.1)
Sodium: 142 mmol/L (ref 135–145)
Total Bilirubin: 0.8 mg/dL (ref 0.0–1.2)
Total Protein: 6.2 g/dL — ABNORMAL LOW (ref 6.5–8.1)

## 2024-03-30 LAB — CBC WITH DIFFERENTIAL/PLATELET
Abs Immature Granulocytes: 0.01 K/uL (ref 0.00–0.07)
Basophils Absolute: 0 K/uL (ref 0.0–0.1)
Basophils Relative: 1 %
Eosinophils Absolute: 0.1 K/uL (ref 0.0–0.5)
Eosinophils Relative: 3 %
HCT: 42 % (ref 39.0–52.0)
Hemoglobin: 13.7 g/dL (ref 13.0–17.0)
Immature Granulocytes: 0 %
Lymphocytes Relative: 48 %
Lymphs Abs: 1.9 K/uL (ref 0.7–4.0)
MCH: 29.5 pg (ref 26.0–34.0)
MCHC: 32.6 g/dL (ref 30.0–36.0)
MCV: 90.5 fL (ref 80.0–100.0)
Monocytes Absolute: 0.3 K/uL (ref 0.1–1.0)
Monocytes Relative: 8 %
Neutro Abs: 1.5 K/uL — ABNORMAL LOW (ref 1.7–7.7)
Neutrophils Relative %: 40 %
Platelets: 244 K/uL (ref 150–400)
RBC: 4.64 MIL/uL (ref 4.22–5.81)
RDW: 13.4 % (ref 11.5–15.5)
WBC: 3.9 K/uL — ABNORMAL LOW (ref 4.0–10.5)
nRBC: 0 % (ref 0.0–0.2)

## 2024-03-30 LAB — POCT FASTING CBG KUC MANUAL ENTRY: POCT Glucose (KUC): 104 mg/dL — AB (ref 70–99)

## 2024-03-30 LAB — POC COVID19/FLU A&B COMBO
Covid Antigen, POC: NEGATIVE
Influenza A Antigen, POC: NEGATIVE
Influenza B Antigen, POC: NEGATIVE

## 2024-03-30 MED ORDER — AMLODIPINE BESYLATE 5 MG PO TABS: 5.0000 mg | ORAL_TABLET | Freq: Every day | ORAL | 0 refills | Status: DC

## 2024-03-30 MED ORDER — AMLODIPINE BESYLATE 5 MG PO TABS: 5.0000 mg | ORAL_TABLET | Freq: Every day | ORAL | 0 refills | Status: AC

## 2024-03-30 NOTE — ED Triage Notes (Signed)
 Pt reports EMS came to his house this morning for weakness . EMS reported his BP was high . Pt has not had money to buy BP meds. Ems reported his BS was low but Pt does not remember the number. Ems suggested he come for Covid testing. Pt has had a cough. Pt denies fever or sore throat. Pt has been around young person who had a cough and cold.

## 2024-03-30 NOTE — Telephone Encounter (Signed)
 Patient presenting back in lobby. Requesting medication from today's visit be sent to the [facility] [address] instead. Medication sent in to preferred pharmacy per protocol.

## 2024-03-30 NOTE — ED Provider Notes (Addendum)
 [facility]    CSN: [id number] Arrival date & time: 03/30/24  1119      History   Chief Complaint Chief Complaint  Patient presents with   Weakness    HPI Timothy Roy is a 48 y.o. male.   Patient presents with weakness and fatigue for about 1 week.  Patient states that he did begin to have a cough about 3 days ago as well.  Patient denies fever, body aches, chills, nausea, vomiting, diarrhea, abdominal pain, chest pain, and shortness of breath.  Patient reports that he did have a EMS come to his house this morning for the weakness and was told with his blood pressure was high and that his blood sugar was low.  EMS had recommended that he come to urgent care for COVID testing due to him having a cough.  Patient states he has been around a younger person who had a cough as well.  Patient states he has been told in the past that he has high blood pressure but never taken medication for it because he could not afford it.  Patient states that he would like to be prescribed blood pressure medication today.  The history is provided by the patient and medical records.  Weakness   Past Medical History:  Diagnosis Date   Cellulitis    Extremity edema    High cholesterol    Hypertension    Obesity     Patient Active Problem List   Diagnosis Date Noted   Morbid obesity (HCC) 11/03/2013   Hyperglycemia 11/03/2013   Heavy alcohol use 11/03/2013   Cellulitis     Past Surgical History:  Procedure Laterality Date   knee surgery         Home Medications    Prior to Admission medications   Medication Sig Start Date End Date Taking? Authorizing Provider  amLODipine  (NORVASC ) 5 MG tablet Take 1 tablet (5 mg total) by mouth daily. 03/30/24   Johnie Flaming A, NP  losartan  (COZAAR ) 100 MG tablet Take 1 tablet (100 mg total) by mouth daily. 05/29/21   Arloa Suzen RAMAN, NP    Family History Family History  Problem Relation Age of Onset   Stroke Mother     Hypertension Father     Social History Social History   Tobacco Use   Smoking status: Every Day    Current packs/day: 0.25    Average packs/day: 0.3 packs/day for 15.0 years (3.8 ttl pk-yrs)    Types: Cigarettes   Smokeless tobacco: Never  Vaping Use   Vaping status: Former  Substance Use Topics   Alcohol use: Yes    Comment: socially   Drug use: No     Allergies   Patient has no known allergies.   Review of Systems Review of Systems  Neurological:  Positive for weakness.   Per HPI  Physical Exam Triage Vital Signs ED Triage Vitals  Encounter Vitals Group     BP 03/30/24 1155 (!) 170/88     Girls Systolic BP Percentile --      Girls Diastolic BP Percentile --      Boys Systolic BP Percentile --      Boys Diastolic BP Percentile --      Pulse Rate 03/30/24 1155 76     Resp 03/30/24 1155 20     Temp 03/30/24 1155 98.2 F (36.8 C)     Temp src --      SpO2 03/30/24 1155 92 %  Weight --      Height --      Head Circumference --      Peak Flow --      Pain Score 03/30/24 1152 0     Pain Loc --      Pain Education --      Exclude from Growth Chart --    No data found.  Updated Vital Signs BP (!) 170/88   Pulse 76   Temp 98.2 F (36.8 C)   Resp 20   SpO2 92%   Visual Acuity Right Eye Distance:   Left Eye Distance:   Bilateral Distance:    Right Eye Near:   Left Eye Near:    Bilateral Near:     Physical Exam Vitals and nursing note reviewed.  Constitutional:      General: He is awake. He is not in acute distress.    Appearance: Normal appearance. He is well-developed and well-groomed. He is not ill-appearing.  HENT:     Nose: Nose normal.     Mouth/Throat:     Mouth: Mucous membranes are moist.     Pharynx: Oropharynx is clear.  Cardiovascular:     Rate and Rhythm: Normal rate and regular rhythm.  Pulmonary:     Effort: Pulmonary effort is normal.     Breath sounds: Normal breath sounds.  Skin:    General: Skin is warm and dry.   Neurological:     General: No focal deficit present.     Mental Status: He is alert and oriented to person, place, and time. Mental status is at baseline.     GCS: GCS eye subscore is 4. GCS verbal subscore is 5. GCS motor subscore is 6.     Cranial Nerves: Cranial nerves 2-12 are intact.     Sensory: Sensation is intact.     Motor: Motor function is intact.     Coordination: Coordination is intact.     Gait: Gait is intact.  Psychiatric:        Behavior: Behavior is cooperative.      UC Treatments / Results  Labs (all labs ordered are listed, but only abnormal results are displayed) Labs Reviewed  POCT FASTING CBG [facility] MANUAL ENTRY - Abnormal; Notable for the following components:      Result Value   POCT Glucose ([facility]) 104 (*)    All other components within normal limits  CBC WITH DIFFERENTIAL/PLATELET  COMPREHENSIVE METABOLIC PANEL WITH GFR  POC COVID19/FLU A&B COMBO    EKG   Radiology DG Chest 2 View Result Date: 03/30/2024 EXAM: 2 VIEW(S) XRAY OF THE CHEST 03/30/2024 12:43:14 PM COMPARISON: 11/23/2022 CLINICAL HISTORY: Cough, weakness. Pt reports EMS came to his house this morning for weakness. EMS reported his BP was high. EMS reported his BS was low but Pt does not remember the number. Pt has had a cough. Pt denies fever or sore throat. Pt has been around young person who had a cough and cold. FINDINGS: LUNGS AND PLEURA: No focal pulmonary opacity. No pulmonary edema. No pleural effusion. No pneumothorax. HEART AND MEDIASTINUM: Cardiomegaly. BONES AND SOFT TISSUES: Thoracic degenerative changes. No acute osseous abnormality. IMPRESSION: 1. No acute findings. 2. Cardiomegaly. Electronically signed by: Waddell Calk MD 03/30/2024 12:51 PM EDT RP Workstation: [id number]    Procedures Procedures (including critical care time)  Medications Ordered in UC Medications - No data to display  Initial Impression / Assessment and Plan / UC Course  I have reviewed the triage vital  signs and the nursing notes.  Pertinent labs & imaging results that were available during my care of the patient were reviewed by me and considered in my medical decision making (see chart for details).     Patient is overall well-appearing.  Vitals are stable.  Blood pressure is elevated at 170/88.  No significant findings on exam.  Lung and heart sounds normal.  No neurological deficits noted on exam.  COVID and flu testing negative.  Chest x-ray ordered to rule out underlying pneumonia.  Based on my interpretation there is no active cardiopulmonary disease.  Radiology report confirms this.  Ordered CBC and CMP to rule out underlying causes related to weakness.  Prescribed amlodipine  for patient to start taking for his blood pressure.  Given patient resources to follow-up with for further evaluation of this.  Discussed follow-up, return, and strict ER precautions. Final Clinical Impressions(s) / UC Diagnoses   Final diagnoses:  Weakness  Acute cough  Essential hypertension     Discharge Instructions      Your chest x-ray did not reveal any pneumonia or underlying infection. Your blood sugar was normal during your visit today. We have drawn some basic labs today to rule out underlying causes related to your weakness.  If there are any concerning results of this, someone will call and advise treatment at that time.  Your blood pressure was elevated today in clinic. I have sent amlodipine , which is a blood pressure medication, that you can start taking once daily to help with this. Follow-up with [facility] for further evaluation and management of your blood pressure. You can also follow-up with the mobile health clinic and I have attached their schedule to the back of your paperwork.  If you develop worsening weakness, trouble breathing, or passing out please seek immediate medical treatment in the emergency department.   ED Prescriptions      Medication Sig Dispense Auth. Provider   amLODipine  (NORVASC ) 5 MG tablet Take 1 tablet (5 mg total) by mouth daily. 30 tablet Johnie Flaming A, NP      PDMP not reviewed this encounter.   Johnie Flaming LABOR, NP 03/30/24 1308    Johnie Flaming A, NP 03/30/24 1309

## 2024-03-30 NOTE — Discharge Instructions (Addendum)
 Your chest x-ray did not reveal any pneumonia or underlying infection. Your blood sugar was normal during your visit today. We have drawn some basic labs today to rule out underlying causes related to your weakness.  If there are any concerning results of this, someone will call and advise treatment at that time.  Your blood pressure was elevated today in clinic. I have sent amlodipine , which is a blood pressure medication, that you can start taking once daily to help with this. Follow-up with [facility] for further evaluation and management of your blood pressure. You can also follow-up with the mobile health clinic and I have attached their schedule to the back of your paperwork.  If you develop worsening weakness, trouble breathing, or passing out please seek immediate medical treatment in the emergency department.

## 2024-03-31 ENCOUNTER — Ambulatory Visit: Payer: Self-pay
